# Patient Record
Sex: Male | Born: 1995 | Race: Black or African American | Hispanic: No | Marital: Married | Smoking: Never smoker
Health system: Southern US, Community
[De-identification: ages and names within clinical notes are randomized; demographics above are authoritative.]

## PROBLEM LIST (undated history)

## (undated) DIAGNOSIS — S83209A Unspecified tear of unspecified meniscus, current injury, unspecified knee, initial encounter: Secondary | ICD-10-CM

## (undated) DIAGNOSIS — F419 Anxiety disorder, unspecified: Secondary | ICD-10-CM

## (undated) DIAGNOSIS — G43909 Migraine, unspecified, not intractable, without status migrainosus: Secondary | ICD-10-CM

## (undated) HISTORY — PX: FRACTURE SURGERY: SHX138

---
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
## 2014-01-18 ENCOUNTER — Encounter (HOSPITAL_COMMUNITY): Payer: Self-pay | Admitting: Emergency Medicine

## 2014-01-18 ENCOUNTER — Emergency Department (HOSPITAL_COMMUNITY)
Admission: EM | Admit: 2014-01-18 | Discharge: 2014-01-18 | Disposition: A | Discharge status: Home / Self Care | Payer: Medicaid Other | Attending: Emergency Medicine | Admitting: Emergency Medicine

## 2014-01-18 DIAGNOSIS — Z87828 Personal history of other (healed) physical injury and trauma: Secondary | ICD-10-CM | POA: Diagnosis not present

## 2014-01-18 DIAGNOSIS — M25562 Pain in left knee: Secondary | ICD-10-CM

## 2014-01-18 DIAGNOSIS — M25569 Pain in unspecified knee: Secondary | ICD-10-CM | POA: Insufficient documentation

## 2014-01-18 MED ORDER — NAPROXEN 500 MG PO TABS: 500.0000 mg | ORAL_TABLET | Freq: Two times a day (BID) | ORAL | Status: DC

## 2014-01-18 NOTE — ED Notes (Signed)
Pt reports left knee pain that started this am. Denies fall or injury. 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 Pain 7/10. Pt ambulates without a limp.

## 2014-01-18 NOTE — Discharge Instructions (Signed)
Take the prescribed medication as directed.  May wish to ice and elevate your knee to help with pain. Wrap knees or wear brace while working for extra support. Follow-up with orthopedics. Return to the ED for new or worsening symptoms.

## 2014-01-18 NOTE — ED Provider Notes (Signed)
CSN: [id number]     Arrival date & time 01/18/14  1010 History   First MD Initiated Contact with Patient 01/18/14 1028     Chief Complaint  Patient presents with  . Knee Pain     (Consider location/radiation/quality/duration/timing/severity/associated sxs/prior Treatment) Patient is a 18 y.o. male presenting with knee pain. The history is provided by the patient and medical records.  Knee Pain  18 y.o. M with left knee pain, onset this morning.  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  He is often bending and squatting to lift heavy boxes.  Pain described as a generalized ache, worse on lateral aspect of his knee.  Denies recent injuries, trauma, or falls.  Pt has prior right knee injuries and usually bears most of his weight on his left knee.  Denies numbness, weakness, paresthesias.  No intervention tried PTA.  Past Medical History  Diagnosis Date  . Meniscus tear    History reviewed. No pertinent past surgical history. Family History  Problem Relation Age of Onset  . Diabetes Other   . Hypertension Other    History  Substance Use Topics  . Smoking status: Never Smoker   . Smokeless tobacco: Not on file  . Alcohol Use: No    Review of Systems  Musculoskeletal: Positive for arthralgias.  All other systems reviewed and are negative.     Allergies  Review of patient's allergies indicates no known allergies.  Home Medications   Prior to Admission medications   Medication Sig Start Date End Date Taking? Authorizing Provider  ibuprofen (ADVIL,MOTRIN) 600 MG tablet Take 1 tablet (600 mg total) by mouth every 6 (six) hours as needed. 08/28/13   Noland Fordyce, PA-C  ibuprofen (ADVIL,MOTRIN) 600 MG tablet Take 1 tablet (600 mg total) by mouth every 6 (six) hours as needed. 12/01/13   Garald Balding, NP  naproxen (NAPROSYN) 500 MG tablet Take 1 tablet (500 mg total) by mouth 2 (two) times daily with a meal. 01/18/14   Larene Pickett, PA-C   BP 117/89   Pulse 78  Temp(Src) 98.6 F (37 C) (Oral)  Resp 16  SpO2 99% Physical Exam  Nursing note and vitals reviewed. Constitutional: He is oriented to person, place, and time. He appears well-developed and well-nourished. No distress.  HENT:  Head: Normocephalic and atraumatic.  Mouth/Throat: Oropharynx is clear and moist.  Eyes: Conjunctivae and EOM are normal. Pupils are equal, round, and reactive to light.  Neck: Normal range of motion. Neck supple.  Cardiovascular: Normal rate, regular rhythm and normal heart sounds.   Pulmonary/Chest: Effort normal and breath sounds normal. No respiratory distress. He has no wheezes.  Musculoskeletal: Normal range of motion.       Left knee: He exhibits normal range of motion, no swelling, no effusion, no ecchymosis, no deformity and no erythema. No tenderness found.  Left knee without focal tenderness, full ROM maintained, mild crepitus noted with full flexion; no gross bony deformities or swelling; DP pulse and sensation intact throughout leg; normal strength; ambulating unassisted without difficulty  Neurological: He is alert and oriented to person, place, and time.  Skin: Skin is warm and dry. He is not diaphoretic.  Psychiatric: He has a normal mood and affect.    ED Course  Procedures (including critical care time) Labs Review Labs Reviewed - No data to display  Imaging Review No results found.   EKG Interpretation None      MDM  Final diagnoses:  Left knee pain   Atraumatic left knee pain.  No bony deformities, leg NVI.  Low suspicion for fx, imaging deferred.  Start on naprosyn, recommended ace wrap/knee sleeve for support.  FU with orthopedics.  Discussed plan with patient, he/she acknowledged understanding and agreed with plan of care.  Return precautions given for new or worsening symptoms.  Larene Pickett, PA-C 01/18/14 1038

## 2014-01-20 NOTE — ED Provider Notes (Signed)
Medical screening examination/treatment/procedure(s) were performed by non-physician practitioner and as supervising physician I was immediately available for consultation/collaboration.  Leota Jacobsen, MD 01/20/14 [phone]

## 2014-01-27 ENCOUNTER — Emergency Department (HOSPITAL_COMMUNITY)
Admission: EM | Admit: 2014-01-27 | Discharge: 2014-01-28 | Disposition: A | Discharge status: Home / Self Care | Payer: Medicaid Other | Attending: Emergency Medicine | Admitting: Emergency Medicine

## 2014-01-27 ENCOUNTER — Encounter (HOSPITAL_COMMUNITY): Payer: Self-pay | Admitting: Emergency Medicine

## 2014-01-27 DIAGNOSIS — Z87828 Personal history of other (healed) physical injury and trauma: Secondary | ICD-10-CM | POA: Diagnosis not present

## 2014-01-27 DIAGNOSIS — R1110 Vomiting, unspecified: Secondary | ICD-10-CM | POA: Insufficient documentation

## 2014-01-27 DIAGNOSIS — Z8679 Personal history of other diseases of the circulatory system: Secondary | ICD-10-CM | POA: Diagnosis not present

## 2014-01-27 DIAGNOSIS — G4489 Other headache syndrome: Secondary | ICD-10-CM | POA: Insufficient documentation

## 2014-01-27 HISTORY — DX: Migraine, unspecified, not intractable, without status migrainosus: G43.909

## 2014-01-27 NOTE — ED Notes (Signed)
Pt states he has a hx of migraines and woke up this morning feeling nauseous and with a bad HA. Pt states he has vomited several times today.

## 2014-01-28 MED ORDER — KETOROLAC TROMETHAMINE 60 MG/2ML IM SOLN
60.0000 mg | Freq: Once | INTRAMUSCULAR | Status: AC
  Administered 2014-01-28: 60 mg via INTRAMUSCULAR
  Filled 2014-01-28: qty 2

## 2014-01-28 MED ORDER — DIPHENHYDRAMINE HCL 25 MG PO CAPS
50.0000 mg | ORAL_CAPSULE | Freq: Once | ORAL | Status: AC
  Administered 2014-01-28: 50 mg via ORAL
  Filled 2014-01-28: qty 2

## 2014-01-28 MED ORDER — METOCLOPRAMIDE HCL 5 MG/ML IJ SOLN
10.0000 mg | Freq: Once | INTRAMUSCULAR | Status: AC
  Administered 2014-01-28: 10 mg via INTRAMUSCULAR
  Filled 2014-01-28: qty 2

## 2014-01-28 NOTE — ED Provider Notes (Signed)
CSN: [id number]     Arrival date & time 01/27/14  2246 History   First MD Initiated Contact with Patient 01/28/14 0005     Chief Complaint  Patient presents with  . Emesis  . Headache     (Consider location/radiation/quality/duration/timing/severity/associated sxs/prior Treatment) Patient is a 18 y.o. male presenting with headaches. The history is provided by the patient. No language interpreter was used.  Headache Associated symptoms: nausea and vomiting   Associated symptoms: no abdominal pain and no fever   Associated symptoms comment:  He complains of headache for 2 days that is similar to his migraine headache pattern. No fever. He has nausea with vomiting, denies photophobia. It is described as a throbbing pain he states is behind both eyes although he has difficulty with determining the location.    Past Medical History  Diagnosis Date  . Meniscus tear   . Migraines    History reviewed. No pertinent past surgical history. Family History  Problem Relation Age of Onset  . Diabetes Other   . Hypertension Other    History  Substance Use Topics  . Smoking status: Never Smoker   . Smokeless tobacco: Never Used  . Alcohol Use: No    Review of Systems  Constitutional: Negative for fever and chills.  Respiratory: Negative.   Cardiovascular: Negative.   Gastrointestinal: Positive for nausea and vomiting. Negative for abdominal pain.  Musculoskeletal: Negative.   Skin: Negative.   Neurological: Positive for headaches.      Allergies  Review of patient's allergies indicates no known allergies.  Home Medications   Prior to Admission medications   Medication Sig Start Date End Date Taking? Authorizing Provider  naproxen (NAPROSYN) 500 MG tablet Take 1 tablet (500 mg total) by mouth 2 (two) times daily with a meal. 01/18/14  Yes Larene Pickett, PA-C   BP 115/70  Pulse 61  Temp(Src) 98.4 F (36.9 C) (Oral)  Resp 16  SpO2 97% Physical Exam  Constitutional: He is  oriented to person, place, and time. He appears well-developed and well-nourished.  HENT:  Head: Normocephalic and atraumatic.  Eyes: EOM are normal. Pupils are equal, round, and reactive to light.  Neck: Normal range of motion.  Cardiovascular: Normal rate and regular rhythm.   No murmur heard. Pulmonary/Chest: Effort normal and breath sounds normal. He has no wheezes. He has no rales.  Abdominal: Soft. There is no tenderness.  Neurological: He is alert and oriented to person, place, and time. He has normal strength and normal reflexes. No sensory deficit. He displays a negative Romberg sign.  He is alert, communicative, NAD, smiling. No deficits of coordination. CN's 3-12 grossly intact.   Skin: Skin is warm and dry.  Psychiatric: He has a normal mood and affect.    ED Course  Procedures (including critical care time) Labs Review Labs Reviewed - No data to display  Imaging Review No results found.   EKG Interpretation None      MDM   Final diagnoses:  None    1. Headache  No neurologic deficits on exam in a very well appearing patient who looks quite comfortable and in NAD. Headache cocktail given. He can be discharged home to follow up with his regular physician.    Dewaine Oats, PA-C 01/28/14 0041

## 2014-01-28 NOTE — ED Provider Notes (Signed)
Medical screening examination/treatment/procedure(s) were conducted as a shared visit with non-physician practitioner(s) or resident  and myself.  I personally evaluated the patient during the encounter and agree with the findings.   I have personally reviewed any xrays and/ or EKG's with the provider and I agree with interpretation.   Headaches previous, gradual onset, multiple previous migraine-like headaches in the past. Patient has not seen neurology. I discussed close followup with primary Dr. and referral for neurology and headaches continued to worsen or become more frequent. Normal neuro exam in ER.5+ strength in UE and LE with f/e at major joints. Neck supple no meningismus Sensation to palpation intact in UE and LE. CNs 2-12 grossly intact.  EOMFI.  PERRL.   Finger nose and coordination intact bilateral.   Visual fields intact to finger testing.  Headache  Mariea Clonts, MD 01/28/14 [phone]

## 2014-01-28 NOTE — Discharge Instructions (Signed)

## 2014-01-29 ENCOUNTER — Emergency Department (HOSPITAL_COMMUNITY)
Admission: EM | Admit: 2014-01-29 | Discharge: 2014-01-29 | Discharge status: Against Medical Advice | Payer: Medicaid Other | Source: Home / Self Care

## 2014-01-29 ENCOUNTER — Encounter (HOSPITAL_COMMUNITY): Payer: Self-pay | Admitting: Emergency Medicine

## 2014-01-29 ENCOUNTER — Emergency Department (HOSPITAL_COMMUNITY)
Admission: EM | Admit: 2014-01-29 | Discharge: 2014-01-30 | Disposition: A | Discharge status: Home / Self Care | Payer: Medicaid Other | Attending: Emergency Medicine | Admitting: Emergency Medicine

## 2014-01-29 DIAGNOSIS — Z87828 Personal history of other (healed) physical injury and trauma: Secondary | ICD-10-CM | POA: Insufficient documentation

## 2014-01-29 DIAGNOSIS — G43909 Migraine, unspecified, not intractable, without status migrainosus: Secondary | ICD-10-CM | POA: Diagnosis not present

## 2014-01-29 DIAGNOSIS — Z791 Long term (current) use of non-steroidal anti-inflammatories (NSAID): Secondary | ICD-10-CM | POA: Diagnosis not present

## 2014-01-29 DIAGNOSIS — R519 Headache, unspecified: Secondary | ICD-10-CM

## 2014-01-29 DIAGNOSIS — R51 Headache: Secondary | ICD-10-CM | POA: Insufficient documentation

## 2014-01-29 DIAGNOSIS — J3489 Other specified disorders of nose and nasal sinuses: Secondary | ICD-10-CM | POA: Diagnosis not present

## 2014-01-29 DIAGNOSIS — Z79899 Other long term (current) drug therapy: Secondary | ICD-10-CM | POA: Diagnosis not present

## 2014-01-29 DIAGNOSIS — R110 Nausea: Secondary | ICD-10-CM | POA: Insufficient documentation

## 2014-01-29 NOTE — ED Notes (Signed)
Called for patient in lobby.  No answer.   Staff at window noted pt left building.  Will try again.

## 2014-01-29 NOTE — ED Notes (Signed)
Called again.  Pt not found in dept.

## 2014-01-29 NOTE — ED Notes (Signed)
Per pt, has hx of headache. Was seen two days ago for same.  Has had headache x 2 weeks.  States MD gave him shots 2 days ago but relief only lasted one day 1.

## 2014-01-30 ENCOUNTER — Encounter (HOSPITAL_COMMUNITY): Payer: Self-pay | Admitting: Emergency Medicine

## 2014-01-30 MED ORDER — OXYMETAZOLINE HCL 0.05 % NA SOLN
2.0000 | Freq: Once | NASAL | Status: AC
  Administered 2014-01-30: 2 via NASAL
  Filled 2014-01-30: qty 15

## 2014-01-30 MED ORDER — IBUPROFEN 200 MG PO TABS
600.0000 mg | ORAL_TABLET | Freq: Once | ORAL | Status: AC
  Administered 2014-01-30: 600 mg via ORAL
  Filled 2014-01-30: qty 3

## 2014-01-30 MED ORDER — IBUPROFEN 600 MG PO TABS: 600.0000 mg | ORAL_TABLET | Freq: Four times a day (QID) | ORAL | Status: DC | PRN

## 2014-01-30 MED ORDER — METOCLOPRAMIDE HCL 10 MG PO TABS: 10.0000 mg | ORAL_TABLET | Freq: Four times a day (QID) | ORAL | Status: DC | PRN

## 2014-01-30 MED ORDER — METOCLOPRAMIDE HCL 10 MG PO TABS
10.0000 mg | ORAL_TABLET | Freq: Once | ORAL | Status: AC
  Administered 2014-01-30: 10 mg via ORAL
  Filled 2014-01-30: qty 1

## 2014-01-30 MED ORDER — TRAMADOL HCL 50 MG PO TABS
50.0000 mg | ORAL_TABLET | Freq: Once | ORAL | Status: AC
  Administered 2014-01-30: 50 mg via ORAL
  Filled 2014-01-30: qty 1

## 2014-01-30 MED ORDER — LORATADINE 10 MG PO TABS: 10.0000 mg | ORAL_TABLET | Freq: Every day | ORAL | Status: DC

## 2014-01-30 MED ORDER — MOMETASONE FUROATE 50 MCG/ACT NA SUSP: 2.0000 | Freq: Every day | NASAL | Status: DC

## 2014-01-30 MED ORDER — LORATADINE 10 MG PO TABS
10.0000 mg | ORAL_TABLET | Freq: Once | ORAL | Status: AC
  Administered 2014-01-30: 10 mg via ORAL
  Filled 2014-01-30: qty 1

## 2014-01-30 MED ORDER — OXYMETAZOLINE HCL 0.05 % NA SOLN: 1.0000 | Freq: Two times a day (BID) | NASAL | Status: DC

## 2014-01-30 MED ORDER — TRAMADOL HCL 50 MG PO TABS: 50.0000 mg | ORAL_TABLET | Freq: Four times a day (QID) | ORAL | Status: DC | PRN

## 2014-01-30 NOTE — ED Provider Notes (Signed)
CSN: [id number]     Arrival date & time 01/29/14  2315 History   First MD Initiated Contact with Patient 01/30/14 0132     Chief Complaint  Patient presents with  . Headache  . Nausea  . Fatigue     (Consider location/radiation/quality/duration/timing/severity/associated sxs/prior Treatment) HPI Patient presents with one week of frontal headache. He has mild nausea but denies any photophobia. Admits to fatigue and subjective fevers. Patient has had nasal congestion with a postnasal drip. Denies cough. Denies neck pain or stiffness. Has no focal weakness or numbness. Has no vision changes. Past Medical History  Diagnosis Date  . Meniscus tear   . Migraines    History reviewed. No pertinent past surgical history. Family History  Problem Relation Age of Onset  . Diabetes Other   . Hypertension Other    History  Substance Use Topics  . Smoking status: Never Smoker   . Smokeless tobacco: Never Used  . Alcohol Use: No    Review of Systems  Constitutional: Positive for fever. Negative for chills.  HENT: Positive for congestion, rhinorrhea and sinus pressure. Negative for sore throat.   Eyes: Negative for photophobia and visual disturbance.  Cardiovascular: Negative for chest pain, palpitations and leg swelling.  Gastrointestinal: Positive for nausea. Negative for vomiting, abdominal pain and diarrhea.  Musculoskeletal: Negative for back pain, neck pain and neck stiffness.  Skin: Negative for rash and wound.  Neurological: Positive for headaches. Negative for dizziness, weakness, light-headedness and numbness.  All other systems reviewed and are negative.     Allergies  Review of patient's allergies indicates no known allergies.  Home Medications   Prior to Admission medications   Medication Sig Start Date End Date Taking? Authorizing Provider  naproxen (NAPROSYN) 500 MG tablet Take 1 tablet (500 mg total) by mouth 2 (two) times daily with a meal. 01/18/14  Yes Larene Pickett, PA-C  ibuprofen (ADVIL,MOTRIN) 600 MG tablet Take 1 tablet (600 mg total) by mouth every 6 (six) hours as needed. 01/30/14   Julianne Rice, MD  loratadine (CLARITIN) 10 MG tablet Take 1 tablet (10 mg total) by mouth daily. One po daily x 5 days 01/30/14   Julianne Rice, MD  metoCLOPramide (REGLAN) 10 MG tablet Take 1 tablet (10 mg total) by mouth every 6 (six) hours as needed for nausea (nausea/headache). 01/30/14   Julianne Rice, MD  mometasone (NASONEX) 50 MCG/ACT nasal spray Place 2 sprays into the nose daily. 01/30/14   Julianne Rice, MD  oxymetazoline (AFRIN NASAL SPRAY) 0.05 % nasal spray Place 1 spray into both nostrils 2 (two) times daily. 01/30/14   Julianne Rice, MD  traMADol (ULTRAM) 50 MG tablet Take 1 tablet (50 mg total) by mouth every 6 (six) hours as needed. 01/30/14   Julianne Rice, MD   BP 135/74  Pulse 72  Temp(Src) 99.4 F (37.4 C) (Oral)  Resp 14  SpO2 97% Physical Exam  Nursing note and vitals reviewed. Constitutional: He is oriented to person, place, and time. He appears well-developed and well-nourished. No distress.  HENT:  Head: Normocephalic and atraumatic.  Mouth/Throat: Oropharynx is clear and moist.  Nasal mucosal edema. Right frontal sinus tenderness to percussion. Bilateral TMs normal.  Eyes: EOM are normal. Pupils are equal, round, and reactive to light.  Neck: Normal range of motion. Neck supple.  No meningismus  Cardiovascular: Normal rate and regular rhythm.   Pulmonary/Chest: Effort normal and breath sounds normal. No respiratory distress. He has no wheezes. He has no  rales. He exhibits no tenderness.  Abdominal: Soft. Bowel sounds are normal. He exhibits no distension and no mass. There is no tenderness. There is no rebound and no guarding.  Musculoskeletal: Normal range of motion. He exhibits no edema and no tenderness.  Neurological: He is alert and oriented to person, place, and time.  5/5 motor in all extremities. Sensation is  intact. Patient is ambulatory without assistance.  Skin: Skin is warm and dry. No rash noted. No erythema.  Psychiatric: He has a normal mood and affect. His behavior is normal.    ED Course  Procedures (including critical care time) Labs Review Labs Reviewed - No data to display  Imaging Review No results found.   EKG Interpretation None      MDM   Final diagnoses:  Sinus headache    Exam consistent with frontal sinusitis. No red flag signs or symptoms necessitating further workup. Advise follow up with his primary doctor. Return precautions given.    Julianne Rice, MD 01/30/14 [phone]

## 2014-01-30 NOTE — ED Notes (Signed)
Pt c/o HA, nausea, fatigue x1 week. Seen here 2 days ago and treated for same. Reports relief but symptoms returned the next day. Denies vomiting, light or sound sensitivity.

## 2014-01-30 NOTE — Discharge Instructions (Signed)
Sinus Headache °A sinus headache is when your sinuses become clogged or swollen. Sinus headaches can range from mild to severe.  °CAUSES °A sinus headache can have different causes, such as: °· Colds. °· Sinus infections. °· Allergies. °SYMPTOMS  °Symptoms of a sinus headache may vary and can include: °· Headache. °· Pain or pressure in the face. °· Congested or runny nose. °· Fever. °· Inability to smell. °· Pain in upper teeth. °Weather changes can make symptoms worse. °TREATMENT  °The treatment of a sinus headache depends on the cause. °· Sinus pain caused by a sinus infection may be treated with antibiotic medicine. °· Sinus pain caused by allergies may be helped by allergy medicines (antihistamines) and medicated nasal sprays. °· Sinus pain caused by congestion may be helped by flushing the nose and sinuses with saline solution. °HOME CARE INSTRUCTIONS  °· If antibiotics are prescribed, take them as directed. Finish them even if you start to feel better. °· Only take over-the-counter or prescription medicines for pain, discomfort, or fever as directed by your caregiver. °· If you have congestion, use a nasal spray to help reduce pressure. °SEEK IMMEDIATE MEDICAL CARE IF: °· You have a fever. °· You have headaches more than once a week. °· You have sensitivity to light or sound. °· You have repeated nausea and vomiting. °· You have vision problems. °· You have sudden, severe pain in your face or head. °· You have a seizure. °· You are confused. °· Your sinus headaches do not get better after treatment. Many people think they have a sinus headache when they actually have migraines or tension headaches. °MAKE SURE YOU:  °· Understand these instructions. °· Will watch your condition. °· Will get help right away if you are not doing well or get worse. °Document Released: 07/05/2004 Document Revised: 08/20/2011 Document Reviewed: 08/26/2010 °[employer]® Patient Information ©2015 [employer]. This information is not  intended to replace advice given to you by your health care provider. Make sure you discuss any questions you have with your health care provider. ° °

## 2014-02-10 ENCOUNTER — Emergency Department (HOSPITAL_COMMUNITY)
Admission: EM | Admit: 2014-02-10 | Discharge: 2014-02-10 | Disposition: A | Discharge status: Home / Self Care | Payer: Medicaid Other | Attending: Emergency Medicine | Admitting: Emergency Medicine

## 2014-02-10 ENCOUNTER — Encounter (HOSPITAL_COMMUNITY): Payer: Self-pay | Admitting: Emergency Medicine

## 2014-02-10 ENCOUNTER — Emergency Department (HOSPITAL_COMMUNITY): Payer: Medicaid Other

## 2014-02-10 DIAGNOSIS — M25569 Pain in unspecified knee: Secondary | ICD-10-CM | POA: Diagnosis present

## 2014-02-10 DIAGNOSIS — G43909 Migraine, unspecified, not intractable, without status migrainosus: Secondary | ICD-10-CM | POA: Diagnosis not present

## 2014-02-10 DIAGNOSIS — Z79899 Other long term (current) drug therapy: Secondary | ICD-10-CM | POA: Insufficient documentation

## 2014-02-10 DIAGNOSIS — Z87828 Personal history of other (healed) physical injury and trauma: Secondary | ICD-10-CM | POA: Diagnosis not present

## 2014-02-10 DIAGNOSIS — M25561 Pain in right knee: Secondary | ICD-10-CM

## 2014-02-10 MED ORDER — MELOXICAM 15 MG PO TABS: 15.0000 mg | ORAL_TABLET | Freq: Every day | ORAL | Status: DC

## 2014-02-10 MED ORDER — HYDROCODONE-ACETAMINOPHEN 5-325 MG PO TABS
2.0000 | ORAL_TABLET | Freq: Once | ORAL | Status: AC
  Administered 2014-02-10: 2 via ORAL
  Filled 2014-02-10: qty 2

## 2014-02-10 MED ORDER — HYDROCODONE-ACETAMINOPHEN 5-325 MG PO TABS: 1.0000 | ORAL_TABLET | ORAL | Status: DC | PRN

## 2014-02-10 NOTE — ED Provider Notes (Signed)
CSN: [id number]     Arrival date & time 02/10/14  1918 History  This chart was scribed for non-physician practitioner, Alvina Chou, PA-C working with Malvin Johns, MD by Einar Pheasant, ED scribe. This patient was seen in room WTR5/WTR5 and the patient's care was started at 9:09 PM.    Chief Complaint  Patient presents with  . Knee Pain   The history is provided by the patient. No language interpreter was used.   HPI Comments: Bradley Larson is a 18 y.o. male who presents to the Emergency Department complaining of worsening right knee pain that has been intermittent for 2 months but worsened 3 days ago. Pt states that the pain is worsened by walking and bearing weight. [name] does report the one episode of his knee "locking up" on him and had to "pop it back in place". Pt reports taking Ibuprofen with minimal relief until he starts work.  He denies any recent falls, abdominal pain, nausea, weakness, SOB, numbness, myalgias, or leg swelling.  Past Medical History  Diagnosis Date  . Meniscus tear   . Migraines    History reviewed. No pertinent past surgical history. Family History  Problem Relation Age of Onset  . Diabetes Other   . Hypertension Other    History  Substance Use Topics  . Smoking status: Never Smoker   . Smokeless tobacco: Never Used  . Alcohol Use: No    Review of Systems  Constitutional: Negative for fever.  Musculoskeletal: Positive for arthralgias. Negative for joint swelling and myalgias.  Neurological: Negative for weakness and numbness.   Allergies  Review of patient's allergies indicates no known allergies.  Home Medications   Prior to Admission medications   Medication Sig Start Date End Date Taking? Authorizing Provider  ibuprofen (ADVIL,MOTRIN) 600 MG tablet Take 1 tablet (600 mg total) by mouth every 6 (six) hours as needed. 01/30/14   Julianne Rice, MD  loratadine (CLARITIN) 10 MG tablet Take 1 tablet (10 mg total) by mouth daily. One  po daily x 5 days 01/30/14   Julianne Rice, MD  metoCLOPramide (REGLAN) 10 MG tablet Take 1 tablet (10 mg total) by mouth every 6 (six) hours as needed for nausea (nausea/headache). 01/30/14   Julianne Rice, MD  mometasone (NASONEX) 50 MCG/ACT nasal spray Place 2 sprays into the nose daily. 01/30/14   Julianne Rice, MD  naproxen (NAPROSYN) 500 MG tablet Take 1 tablet (500 mg total) by mouth 2 (two) times daily with a meal. 01/18/14   Larene Pickett, PA-C  oxymetazoline (AFRIN NASAL SPRAY) 0.05 % nasal spray Place 1 spray into both nostrils 2 (two) times daily. 01/30/14   Julianne Rice, MD  traMADol (ULTRAM) 50 MG tablet Take 1 tablet (50 mg total) by mouth every 6 (six) hours as needed. 01/30/14   Julianne Rice, MD   BP 130/79  Pulse 91  Temp(Src) 98.2 F (36.8 C) (Oral)  Resp 18  SpO2 98%  Physical Exam  Nursing note and vitals reviewed. Constitutional: He is oriented to person, place, and time. He appears well-developed and well-nourished. No distress.  HENT:  Head: Normocephalic and atraumatic.  Eyes: Conjunctivae and EOM are normal.  Neck: Neck supple.  Cardiovascular: Normal rate, regular rhythm, normal heart sounds and intact distal pulses.  Exam reveals no gallop and no friction rub.   No murmur heard. Pulmonary/Chest: Effort normal. No respiratory distress.  Musculoskeletal: Normal range of motion.  FROM of right knee no tenderness to palpation. Mild edema noted  to generalized knee. No palpiteal tenderness. No obvious deformities. Pt reports pain when standing.   Negative Lachman and anterior drawer test of the right knee.   Neurological: He is alert and oriented to person, place, and time.  Skin: Skin is warm and dry.  Psychiatric: He has a normal mood and affect. His behavior is normal.    ED Course  Procedures (including critical care time)  DIAGNOSTIC STUDIES: Oxygen Saturation is 98% on RA, normal by my interpretation.    COORDINATION OF CARE: 9:13 PM-Advised  pt to keep the right knee elevated and apply cold compresses. Pt states that he has crutches and a knee brace at home. Will give pt a referral to on call orthopaedist.  Will prescribe pain medication and give pt a note off or work. Pt advised of plan for treatment and pt agrees.  Imaging Review Dg Knee 2 Views Right  02/10/2014   CLINICAL DATA:  KNEE PAIN  EXAM: RIGHT KNEE - 1-2 VIEW  COMPARISON:  05/21/2012  FINDINGS: There is no evidence of fracture, dislocation, or joint effusion. Progressive sclerotic changes in the lateral tibial plateau and marginal spurring from the lateral femoral condyle. Soft tissues are unremarkable.  IMPRESSION: 1. Negative for fracture or other acute bone abnormality. 2. Progressive lateral compartment degenerative change.   Electronically Signed   By: Arne Cleveland M.D.   On: 02/10/2014 20:30     MDM   Final diagnoses:  Right knee pain    9:20 PM Patient's xray unremarkable for acute fracture. Patient has a knee sleeve that he is currently wearing. I have advised him to continue wearing the knee sleeve. Patient will have recommended follow up with Orthopedics for further evaluation. Patient will have mobic and Vicodin for pain. No neurovascular compromise. No other injuries. Vitals stable and patient afebrile.   I personally performed the services described in this documentation, which was scribed in my presence. The recorded information has been reviewed and is accurate.    Alvina Chou, PA-C 02/10/14 2122

## 2014-02-10 NOTE — ED Notes (Signed)
Pt states that he had right knee pain off and on for several months; pt states that he landed on it "funny" on Monday; pt c/o rt knee pain; pt states pain is worse when weight is applied; pt able to bend and move without difficulty; pt states "It hurts to that but not as bad as walking"

## 2014-02-10 NOTE — Discharge Instructions (Signed)
Rest, ice and elevate your knee for pain relief. Follow up with Dr. Marlou Sa for further evaluation of your knee pain. Take Mobic for pain as needed. Take Vicodin as needed for extra pain relief when you are home and not driving or working. Refer to attached documents for more information.

## 2014-02-11 NOTE — ED Provider Notes (Signed)
Medical screening examination/treatment/procedure(s) were performed by non-physician practitioner and as supervising physician I was immediately available for consultation/collaboration.   EKG Interpretation None        Malvin Johns, MD 02/11/14 [phone]

## 2014-02-18 ENCOUNTER — Encounter (HOSPITAL_COMMUNITY): Payer: Self-pay | Admitting: Emergency Medicine

## 2014-02-18 ENCOUNTER — Emergency Department (HOSPITAL_COMMUNITY)
Admission: EM | Admit: 2014-02-18 | Discharge: 2014-02-18 | Disposition: A | Discharge status: Home / Self Care | Payer: Medicaid Other | Attending: Emergency Medicine | Admitting: Emergency Medicine

## 2014-02-18 DIAGNOSIS — S8990XA Unspecified injury of unspecified lower leg, initial encounter: Secondary | ICD-10-CM | POA: Diagnosis not present

## 2014-02-18 DIAGNOSIS — Y9289 Other specified places as the place of occurrence of the external cause: Secondary | ICD-10-CM | POA: Diagnosis not present

## 2014-02-18 DIAGNOSIS — Y9389 Activity, other specified: Secondary | ICD-10-CM | POA: Insufficient documentation

## 2014-02-18 DIAGNOSIS — Z79899 Other long term (current) drug therapy: Secondary | ICD-10-CM | POA: Diagnosis not present

## 2014-02-18 DIAGNOSIS — G43909 Migraine, unspecified, not intractable, without status migrainosus: Secondary | ICD-10-CM | POA: Insufficient documentation

## 2014-02-18 DIAGNOSIS — X500XXA Overexertion from strenuous movement or load, initial encounter: Secondary | ICD-10-CM | POA: Insufficient documentation

## 2014-02-18 DIAGNOSIS — IMO0002 Reserved for concepts with insufficient information to code with codable children: Secondary | ICD-10-CM | POA: Diagnosis not present

## 2014-02-18 DIAGNOSIS — S99919A Unspecified injury of unspecified ankle, initial encounter: Principal | ICD-10-CM

## 2014-02-18 DIAGNOSIS — M25571 Pain in right ankle and joints of right foot: Secondary | ICD-10-CM

## 2014-02-18 DIAGNOSIS — S99929A Unspecified injury of unspecified foot, initial encounter: Principal | ICD-10-CM

## 2014-02-18 DIAGNOSIS — M25561 Pain in right knee: Secondary | ICD-10-CM

## 2014-02-18 MED ORDER — NAPROXEN 500 MG PO TABS: 500.0000 mg | ORAL_TABLET | Freq: Two times a day (BID) | ORAL | Status: DC

## 2014-02-18 NOTE — ED Notes (Signed)
Pt called a cab for transport

## 2014-02-18 NOTE — ED Notes (Signed)
Pt reports R knee pain, has been seen here for same last week.  No getting any better.

## 2014-02-18 NOTE — ED Provider Notes (Signed)
CSN: [id number]     Arrival date & time 02/18/14  2109 History  This chart was scribed for Antonietta Breach, PA-C working with Kalman Drape, MD by Randa Evens, ED Scribe. This patient was seen in room WTR8/WTR8 and the patient's care was started at 11:09 PM.      Chief Complaint  Patient presents with  . Knee Pain  . Ankle Pain   Patient is a 18 y.o. male presenting with knee pain and ankle pain. The history is provided by the patient. No language interpreter was used.  Knee Pain Ankle Pain  HPI Comments: Bradley Larson is a 18 y.o. male who presents to the Emergency Departmentcomplaining of right knee onset May 2015. He states he has associated right ankle pain. He states that he may have tore a ligament in his knee. He states that he was walking up the steps and felt a pop in his knee and the knee locked up on him. He states that he has been having problem with the knee since being hit with a forklift pallet 4 months ago at work. He states that his knee pain worsens when at work due to bending, lifting and being on his feet for extended periods of time. He states he has been taking ibuprofen with some relief. He states he has an follow up appointment with his PCP in October.   Past Medical History  Diagnosis Date  . Meniscus tear   . Migraines    History reviewed. No pertinent past surgical history. Family History  Problem Relation Age of Onset  . Diabetes Other   . Hypertension Other    History  Substance Use Topics  . Smoking status: Never Smoker   . Smokeless tobacco: Never Used  . Alcohol Use: No    Review of Systems  Musculoskeletal: Positive for arthralgias. Negative for gait problem.    Allergies  Review of patient's allergies indicates no known allergies.  Home Medications   Prior to Admission medications   Medication Sig Start Date End Date Taking? Authorizing Provider  HYDROcodone-acetaminophen (NORCO/VICODIN) 5-325 MG per tablet Take 1-2 tablets by mouth  every 4 (four) hours as needed for moderate pain or severe pain. 02/10/14  Yes Kaitlyn Szekalski, PA-C  ibuprofen (ADVIL,MOTRIN) 600 MG tablet Take 1 tablet (600 mg total) by mouth every 6 (six) hours as needed. 01/30/14  Yes Julianne Rice, MD  loratadine (CLARITIN) 10 MG tablet Take 1 tablet (10 mg total) by mouth daily. One po daily x 5 days 01/30/14  Yes Julianne Rice, MD  metoCLOPramide (REGLAN) 10 MG tablet Take 1 tablet (10 mg total) by mouth every 6 (six) hours as needed for nausea (nausea/headache). 01/30/14  Yes Julianne Rice, MD  mometasone (NASONEX) 50 MCG/ACT nasal spray Place 2 sprays into the nose daily. 01/30/14  Yes Julianne Rice, MD  oxymetazoline (AFRIN NASAL SPRAY) 0.05 % nasal spray Place 1 spray into both nostrils 2 (two) times daily. 01/30/14  Yes Julianne Rice, MD  traMADol (ULTRAM) 50 MG tablet Take 1 tablet (50 mg total) by mouth every 6 (six) hours as needed. 01/30/14  Yes Julianne Rice, MD  naproxen (NAPROSYN) 500 MG tablet Take 1 tablet (500 mg total) by mouth 2 (two) times daily. 02/18/14   Antonietta Breach, PA-C   Triage Vitals: BP 124/80  Pulse 92  Resp 20  SpO2 100%  Physical Exam  Nursing note and vitals reviewed. Constitutional: He is oriented to person, place, and time. He appears well-developed and well-nourished.  No distress.  Nontoxic/nonseptic appearing  HENT:  Head: Normocephalic and atraumatic.  Eyes: Conjunctivae and EOM are normal. No scleral icterus.  Neck: Normal range of motion.  Cardiovascular: Normal rate, regular rhythm and intact distal pulses.   DP and PT pulses 2+ b/l  Pulmonary/Chest: Effort normal. No respiratory distress.  Musculoskeletal: Normal range of motion. He exhibits tenderness.       Right knee: He exhibits normal range of motion, no effusion, no deformity, no erythema, normal alignment, no LCL laxity, no bony tenderness and no MCL laxity. Tenderness found. Medial joint line tenderness noted.       Right ankle: Normal.        Right lower leg: Normal.  No bony tenderness, crepitus, or deformity of R knee. 5/5 strength against resistance with knee flexion and extension.  Neurological: He is alert and oriented to person, place, and time. He exhibits normal muscle tone. Coordination normal.  No gross sensory deficits appreciated. Patellar and Achilles reflexes 2+ bilaterally. Patient ambulatory with normal gait.  Skin: Skin is warm and dry. No rash noted. He is not diaphoretic. No erythema. No pallor.  Psychiatric: He has a normal mood and affect. His behavior is normal.    ED Course  Procedures (including critical care time) DIAGNOSTIC STUDIES: Oxygen Saturation is 100% on RA, normal by my interpretation.    COORDINATION OF CARE: 11:19 PM-Discussed treatment plan which includes orthopedic referral with pt at bedside and pt agreed to plan.   Labs Review Labs Reviewed - No data to display  Imaging Review No results found.   EKG Interpretation None      MDM   Final diagnoses:  Right knee pain  Ankle pain, right    Patient presents for persistent right knee pain. He states that his knee "locked up" on him today. He states he has had similar pain for the past 5+ months. His occupation requires frequent bending and lifting. Patient today is neurovascularly intact. No gross sensory deficits appreciated. He is able to ambulate with normal gait. No bony deformity or laxity to suggest the need for emergent imaging. Patient in knee sleeve which he wore to the emergency department. Have advised RICE and Naproxen and will refer to orthopedics given chronicity of symptoms. Return precautions discussed and provided. Patient agreeable to plan with no unaddressed concerns.  I personally performed the services described in this documentation, which was scribed in my presence. The recorded information has been reviewed and is accurate.   Filed Vitals:   02/18/14 2137  BP: 124/80  Pulse: 92  Resp: 20  SpO2: 100%        Antonietta Breach, PA-C 02/19/14 0017

## 2014-02-18 NOTE — Discharge Instructions (Signed)
Knee Pain °The knee is the complex joint between your thigh and your lower leg. It is made up of bones, tendons, ligaments, and cartilage. The bones that make up the knee are: °· The femur in the thigh. °· The tibia and fibula in the lower leg. °· The patella or kneecap riding in the groove on the lower femur. °CAUSES  °Knee pain is a common complaint with many causes. A few of these causes are: °· Injury, such as: °¨ A ruptured ligament or tendon injury. °¨ Torn cartilage. °· Medical conditions, such as: °¨ Gout °¨ Arthritis °¨ Infections °· Overuse, over training, or overdoing a physical activity. °Knee pain can be minor or severe. Knee pain can accompany debilitating injury. Minor knee problems often respond well to self-care measures or get well on their own. More serious injuries may need medical intervention or even surgery. °SYMPTOMS °The knee is complex. Symptoms of knee problems can vary widely. Some of the problems are: °· Pain with movement and weight bearing. °· Swelling and tenderness. °· Buckling of the knee. °· Inability to straighten or extend your knee. °· Your knee locks and you cannot straighten it. °· Warmth and redness with pain and fever. °· Deformity or dislocation of the kneecap. °DIAGNOSIS  °Determining what is wrong may be very straight forward such as when there is an injury. It can also be challenging because of the complexity of the knee. Tests to make a diagnosis may include: °· Your caregiver taking a history and doing a physical exam. °· Routine X-rays can be used to rule out other problems. X-rays will not reveal a cartilage tear. Some injuries of the knee can be diagnosed by: °¨ Arthroscopy a surgical technique by which a small video camera is inserted through tiny incisions on the sides of the knee. This procedure is used to examine and repair internal knee joint problems. Tiny instruments can be used during arthroscopy to repair the torn knee cartilage (meniscus). °¨ Arthrography  is a radiology technique. A contrast liquid is directly injected into the knee joint. Internal structures of the knee joint then become visible on X-ray film. °¨ An MRI scan is a non X-ray radiology procedure in which magnetic fields and a computer produce two- or three-dimensional images of the inside of the knee. Cartilage tears are often visible using an MRI scanner. MRI scans have largely replaced arthrography in diagnosing cartilage tears of the knee. °· Blood work. °· Examination of the fluid that helps to lubricate the knee joint (synovial fluid). This is done by taking a sample out using a needle and a syringe. °TREATMENT °The treatment of knee problems depends on the cause. Some of these treatments are: °· Depending on the injury, proper casting, splinting, surgery, or physical therapy care will be needed. °· Give yourself adequate recovery time. Do not overuse your joints. If you begin to get sore during workout routines, back off. Slow down or do fewer repetitions. °· For repetitive activities such as cycling or running, maintain your strength and nutrition. °· Alternate muscle groups. For example, if you are a weight lifter, work the upper body on one day and the lower body the next. °· Either tight or weak muscles do not give the proper support for your knee. Tight or weak muscles do not absorb the stress placed on the knee joint. Keep the muscles surrounding the knee strong. °· Take care of mechanical problems. °¨ If you have flat feet, orthotics or special shoes may help.   See your caregiver if you need help. °¨ Arch supports, sometimes with wedges on the inner or outer aspect of the heel, can help. These can shift pressure away from the side of the knee most bothered by osteoarthritis. °¨ A brace called an "unloader" brace also may be used to help ease the pressure on the most arthritic side of the knee. °· If your caregiver has prescribed crutches, braces, wraps or ice, use as directed. The acronym  for this is PRICE. This means protection, rest, ice, compression, and elevation. °· Nonsteroidal anti-inflammatory drugs (NSAIDs), can help relieve pain. But if taken immediately after an injury, they may actually increase swelling. Take NSAIDs with food in your stomach. Stop them if you develop stomach problems. Do not take these if you have a history of ulcers, stomach pain, or bleeding from the bowel. Do not take without your caregiver's approval if you have problems with fluid retention, heart failure, or kidney problems. °· For ongoing knee problems, physical therapy may be helpful. °· Glucosamine and chondroitin are over-the-counter dietary supplements. Both may help relieve the pain of osteoarthritis in the knee. These medicines are different from the usual anti-inflammatory drugs. Glucosamine may decrease the rate of cartilage destruction. °· Injections of a corticosteroid drug into your knee joint may help reduce the symptoms of an arthritis flare-up. They may provide pain relief that lasts a few months. You may have to wait a few months between injections. The injections do have a small increased risk of infection, water retention, and elevated blood sugar levels. °· Hyaluronic acid injected into damaged joints may ease pain and provide lubrication. These injections may work by reducing inflammation. A series of shots may give relief for as long as 6 months. °· Topical painkillers. Applying certain ointments to your skin may help relieve the pain and stiffness of osteoarthritis. Ask your pharmacist for suggestions. Many over the-counter products are approved for temporary relief of arthritis pain. °· In some countries, doctors often prescribe topical NSAIDs for relief of chronic conditions such as arthritis and tendinitis. A review of treatment with NSAID creams found that they worked as well as oral medications but without the serious side effects. °PREVENTION °· Maintain a healthy weight. Extra pounds  put more strain on your joints. °· Get strong, stay limber. Weak muscles are a common cause of knee injuries. Stretching is important. Include flexibility exercises in your workouts. °· Be smart about exercise. If you have osteoarthritis, chronic knee pain or recurring injuries, you may need to change the way you exercise. This does not mean you have to stop being active. If your knees ache after jogging or playing basketball, consider switching to swimming, water aerobics, or other low-impact activities, at least for a few days a week. Sometimes limiting high-impact activities will provide relief. °· Make sure your shoes fit well. Choose footwear that is right for your sport. °· Protect your knees. Use the proper gear for knee-sensitive activities. Use kneepads when playing volleyball or laying carpet. Buckle your seat belt every time you drive. Most shattered kneecaps occur in car accidents. °· Rest when you are tired. °SEEK MEDICAL CARE IF:  °You have knee pain that is continual and does not seem to be getting better.  °SEEK IMMEDIATE MEDICAL CARE IF:  °Your knee joint feels hot to the touch and you have a high fever. °MAKE SURE YOU:  °· Understand these instructions. °· Will watch your condition. °· Will get help right away if you are not   doing well or get worse. °Document Released: 03/25/2007 Document Revised: 08/20/2011 Document Reviewed: 03/25/2007 °[employer]® Patient Information ©2015 [employer]. This information is not intended to replace advice given to you by your health care provider. Make sure you discuss any questions you have with your health care provider. ° ° °RICE: Routine Care for Injuries °The routine care of many injuries includes Rest, Ice, Compression, and Elevation (RICE). °HOME CARE INSTRUCTIONS °· Rest is needed to allow your body to heal. Routine activities can usually be resumed when comfortable. Injured tendons and bones can take up to 6 weeks to heal. Tendons are the cord-like  structures that attach muscle to bone. °· Ice following an injury helps keep the swelling down and reduces pain. °¨ Put ice in a plastic bag. °¨ Place a towel between your skin and the bag. °¨ Leave the ice on for 15-20 minutes, 3-4 times a day, or as directed by your health care provider. Do this while awake, for the first 24 to 48 hours. After that, continue as directed by your caregiver. °· Compression helps keep swelling down. It also gives support and helps with discomfort. If an elastic bandage has been applied, it should be removed and reapplied every 3 to 4 hours. It should not be applied tightly, but firmly enough to keep swelling down. Watch fingers or toes for swelling, bluish discoloration, coldness, numbness, or excessive pain. If any of these problems occur, remove the bandage and reapply loosely. Contact your caregiver if these problems continue. °· Elevation helps reduce swelling and decreases pain. With extremities, such as the arms, hands, legs, and feet, the injured area should be placed near or above the level of the heart, if possible. °SEEK IMMEDIATE MEDICAL CARE IF: °· You have persistent pain and swelling. °· You develop redness, numbness, or unexpected weakness. °· Your symptoms are getting worse rather than improving after several days. °These symptoms may indicate that further evaluation or further X-rays are needed. Sometimes, X-rays may not show a small broken bone (fracture) until 1 week or 10 days later. Make a follow-up appointment with your caregiver. Ask when your X-ray results will be ready. Make sure you get your X-ray results. °Document Released: 09/09/2000 Document Revised: 06/02/2013 Document Reviewed: 10/27/2010 °Exit[employer]® Patient Information ©2015 [employer]. This information is not intended to replace advice given to you by your health care provider. Make sure you discuss any questions you have with your health care provider. ° °

## 2014-02-18 NOTE — ED Notes (Signed)
Pt was seen and treated last week for this knee pain, tonight he thinks he reinjured the same knee

## 2014-02-19 NOTE — ED Provider Notes (Signed)
Medical screening examination/treatment/procedure(s) were performed by non-physician practitioner and as supervising physician I was immediately available for consultation/collaboration.   EKG Interpretation None       Kalman Drape, MD 02/19/14 [phone]

## 2014-02-28 ENCOUNTER — Emergency Department (HOSPITAL_COMMUNITY)
Admission: EM | Admit: 2014-02-28 | Discharge: 2014-02-28 | Disposition: A | Discharge status: Home / Self Care | Payer: Medicaid Other | Attending: Emergency Medicine | Admitting: Emergency Medicine

## 2014-02-28 ENCOUNTER — Encounter (HOSPITAL_COMMUNITY): Payer: Self-pay | Admitting: Emergency Medicine

## 2014-02-28 DIAGNOSIS — Z791 Long term (current) use of non-steroidal anti-inflammatories (NSAID): Secondary | ICD-10-CM | POA: Diagnosis not present

## 2014-02-28 DIAGNOSIS — J069 Acute upper respiratory infection, unspecified: Secondary | ICD-10-CM

## 2014-02-28 DIAGNOSIS — R059 Cough, unspecified: Secondary | ICD-10-CM | POA: Diagnosis present

## 2014-02-28 DIAGNOSIS — R05 Cough: Secondary | ICD-10-CM | POA: Insufficient documentation

## 2014-02-28 DIAGNOSIS — Z8679 Personal history of other diseases of the circulatory system: Secondary | ICD-10-CM | POA: Insufficient documentation

## 2014-02-28 DIAGNOSIS — Z87828 Personal history of other (healed) physical injury and trauma: Secondary | ICD-10-CM | POA: Diagnosis not present

## 2014-02-28 NOTE — ED Notes (Signed)
Pt c/o dry cough, nasal congestion and fatigue x 3 days.

## 2014-02-28 NOTE — Discharge Instructions (Signed)
Take acetaminophen (Tylenol) up to 975 mg (this is normally 3 over-the-counter pills) up to 3 times a day. Do not drink alcohol. Make sure your other medications do not contain acetaminophen (Read the labels!)  Push fluids: take small frequent sips of water or Gatorade, do not drink any soda, juice or caffeinated beverages.    Please follow with your primary care doctor in the next 2 days for a check-up. They must obtain records for further management.   Do not hesitate to return to the Emergency Department for any new, worsening or concerning symptoms.    Upper Respiratory Infection, Adult An upper respiratory infection (URI) is also sometimes known as the common cold. The upper respiratory tract includes the nose, sinuses, throat, trachea, and bronchi. Bronchi are the airways leading to the lungs. Most people improve within 1 week, but symptoms can last up to 2 weeks. A residual cough may last even longer.  CAUSES Many different viruses can infect the tissues lining the upper respiratory tract. The tissues become irritated and inflamed and often become very moist. Mucus production is also common. A cold is contagious. You can easily spread the virus to others by oral contact. This includes kissing, sharing a glass, coughing, or sneezing. Touching your mouth or nose and then touching a surface, which is then touched by another person, can also spread the virus. SYMPTOMS  Symptoms typically develop 1 to 3 days after you come in contact with a cold virus. Symptoms vary from person to person. They may include:  Runny nose.  Sneezing.  Nasal congestion.  Sinus irritation.  Sore throat.  Loss of voice (laryngitis).  Cough.  Fatigue.  Muscle aches.  Loss of appetite.  Headache.  Low-grade fever. DIAGNOSIS  You might diagnose your own cold based on familiar symptoms, since most people get a cold 2 to 3 times a year. Your caregiver can confirm this based on your exam. Most  importantly, your caregiver can check that your symptoms are not due to another disease such as strep throat, sinusitis, pneumonia, asthma, or epiglottitis. Blood tests, throat tests, and X-rays are not necessary to diagnose a common cold, but they may sometimes be helpful in excluding other more serious diseases. Your caregiver will decide if any further tests are required. RISKS AND COMPLICATIONS  You may be at risk for a more severe case of the common cold if you smoke cigarettes, have chronic heart disease (such as heart failure) or lung disease (such as asthma), or if you have a weakened immune system. The very young and very old are also at risk for more serious infections. Bacterial sinusitis, middle ear infections, and bacterial pneumonia can complicate the common cold. The common cold can worsen asthma and chronic obstructive pulmonary disease (COPD). Sometimes, these complications can require emergency medical care and may be life-threatening. PREVENTION  The best way to protect against getting a cold is to practice good hygiene. Avoid oral or hand contact with people with cold symptoms. Wash your hands often if contact occurs. There is no clear evidence that vitamin C, vitamin E, echinacea, or exercise reduces the chance of developing a cold. However, it is always recommended to get plenty of rest and practice good nutrition. TREATMENT  Treatment is directed at relieving symptoms. There is no cure. Antibiotics are not effective, because the infection is caused by a virus, not by bacteria. Treatment may include:  Increased fluid intake. Sports drinks offer valuable electrolytes, sugars, and fluids.  Breathing heated mist or  steam (vaporizer or shower).  Eating chicken soup or other clear broths, and maintaining good nutrition.  Getting plenty of rest.  Using gargles or lozenges for comfort.  Controlling fevers with ibuprofen or acetaminophen as directed by your caregiver.  Increasing  usage of your inhaler if you have asthma. Zinc gel and zinc lozenges, taken in the first 24 hours of the common cold, can shorten the duration and lessen the severity of symptoms. Pain medicines may help with fever, muscle aches, and throat pain. A variety of non-prescription medicines are available to treat congestion and runny nose. Your caregiver can make recommendations and may suggest nasal or lung inhalers for other symptoms.  HOME CARE INSTRUCTIONS   Only take over-the-counter or prescription medicines for pain, discomfort, or fever as directed by your caregiver.  Use a warm mist humidifier or inhale steam from a shower to increase air moisture. This may keep secretions moist and make it easier to breathe.  Drink enough water and fluids to keep your urine clear or pale yellow.  Rest as needed.  Return to work when your temperature has returned to normal or as your caregiver advises. You may need to stay home longer to avoid infecting others. You can also use a face mask and careful hand washing to prevent spread of the virus. SEEK MEDICAL CARE IF:   After the first few days, you feel you are getting worse rather than better.  You need your caregiver's advice about medicines to control symptoms.  You develop chills, worsening shortness of breath, or brown or red sputum. These may be signs of pneumonia.  You develop yellow or brown nasal discharge or pain in the face, especially when you bend forward. These may be signs of sinusitis.  You develop a fever, swollen neck glands, pain with swallowing, or white areas in the back of your throat. These may be signs of strep throat. SEEK IMMEDIATE MEDICAL CARE IF:   You have a fever.  You develop severe or persistent headache, ear pain, sinus pain, or chest pain.  You develop wheezing, a prolonged cough, cough up blood, or have a change in your usual mucus (if you have chronic lung disease).  You develop sore muscles or a stiff  neck. Document Released: 11/21/2000 Document Revised: 08/20/2011 Document Reviewed: 09/02/2013 [facility] Patient Information 2015 [place], [place]. This information is not intended to replace advice given to you by your health care provider. Make sure you discuss any questions you have with your health care provider.

## 2014-02-28 NOTE — ED Provider Notes (Signed)
CSN: [id number]     Arrival date & time 02/28/14  1854 History  This chart was scribed for a non-physician practitioner, Monico Blitz, PA-C working with Arbie Cookey, MD by [place]ique Peace, ED Scribe. The patient was seen in [facility]. The patient's care was started at 7:30 PM.    Chief Complaint  Patient presents with  . Cough  . Nasal Congestion  . Fatigue      The history is provided by the patient. No language interpreter was used.   HPI Comments: Bradley Larson is a 18 y.o. male who presents to the Emergency Department complaining of nasal congestion onset 3 days with associated dry cough, fatigue, SOB, and headache. Pt reports experiencing rhinorrhea and one episode of vomiting yesterday. Pt states that he did not formally take his temperature but has been feeling very hot. He denies any chills, rash, cervicalgia, recent travel, tick bites or camping.   Past Medical History  Diagnosis Date  . Meniscus tear   . Migraines    No past surgical history on file. Family History  Problem Relation Age of Onset  . Diabetes Other   . Hypertension Other    History  Substance Use Topics  . Smoking status: Never Smoker   . Smokeless tobacco: Never Used  . Alcohol Use: No    Review of Systems  Constitutional: Positive for fever and fatigue. Negative for chills.  HENT: Positive for congestion and rhinorrhea.   Respiratory: Positive for cough and shortness of breath.   Gastrointestinal: Positive for vomiting.  Neurological: Positive for headaches.   A complete 10 system review of systems was obtained and all systems are negative except as noted in the HPI and PMH.     Allergies  Review of patient's allergies indicates no known allergies.  Home Medications   Prior to Admission medications   Medication Sig Start Date End Date Taking? Authorizing Provider  HYDROcodone-acetaminophen (NORCO/VICODIN) 5-325 MG per tablet Take 1-2 tablets by mouth every 4 (four) hours  as needed for moderate pain or severe pain. 02/10/14   Kaitlyn Szekalski, PA-C  ibuprofen (ADVIL,MOTRIN) 600 MG tablet Take 1 tablet (600 mg total) by mouth every 6 (six) hours as needed. 01/30/14   Julianne Rice, MD  loratadine (CLARITIN) 10 MG tablet Take 1 tablet (10 mg total) by mouth daily. One po daily x 5 days 01/30/14   Julianne Rice, MD  metoCLOPramide (REGLAN) 10 MG tablet Take 1 tablet (10 mg total) by mouth every 6 (six) hours as needed for nausea (nausea/headache). 01/30/14   Julianne Rice, MD  mometasone (NASONEX) 50 MCG/ACT nasal spray Place 2 sprays into the nose daily. 01/30/14   Julianne Rice, MD  naproxen (NAPROSYN) 500 MG tablet Take 1 tablet (500 mg total) by mouth 2 (two) times daily. 02/18/14   Antonietta Breach, PA-C  oxymetazoline (AFRIN NASAL SPRAY) 0.05 % nasal spray Place 1 spray into both nostrils 2 (two) times daily. 01/30/14   Julianne Rice, MD  traMADol (ULTRAM) 50 MG tablet Take 1 tablet (50 mg total) by mouth every 6 (six) hours as needed. 01/30/14   Julianne Rice, MD   BP 132/71  Pulse 88  Temp(Src) 98.1 F (36.7 C) (Oral)  Resp 18  SpO2 100% Physical Exam  Nursing note and vitals reviewed. Constitutional: He is oriented to person, place, and time. He appears well-developed and well-nourished. No distress.  HENT:  Head: Normocephalic.  Mouth/Throat: Oropharynx is clear and moist.  + Rhinorrhea, no tenderness palpation over  maxillary or frontal sinuses. Tympanic membranes with normal architecture and good light reflex, posterior pharynx is mildly injected, no tonsillar hypertrophy or exudate.  Eyes: Conjunctivae and EOM are normal. Pupils are equal, round, and reactive to light.  Neck: Normal range of motion. Neck supple.  FROM to C-spine. Pt can touch chin to chest without discomfort. No TTP of midline cervical spine.   Cardiovascular: Normal rate, regular rhythm and intact distal pulses.   Pulmonary/Chest: Effort normal and breath sounds normal. No stridor.  No respiratory distress. He has no wheezes. He has no rales. He exhibits no tenderness.  Abdominal: Soft. Bowel sounds are normal. He exhibits no distension and no mass. There is no tenderness. There is no rebound and no guarding.  Musculoskeletal: Normal range of motion.  Neurological: He is alert and oriented to person, place, and time.  Psychiatric: He has a normal mood and affect.    ED Course  Procedures (including critical care time) Labs Review Labs Reviewed - No data to display  Results for orders placed during the hospital encounter of 08/28/13  RAPID STREP SCREEN      Result Value Ref Range   Streptococcus, Group A Screen (Direct) NEGATIVE  NEGATIVE  CULTURE, GROUP A STREP      Result Value Ref Range   Specimen Description THROAT     Special Requests NONE     Culture       Value: No Beta Hemolytic Streptococci Isolated     Performed at [facility]   Report Status 08/30/2013 FINAL     Dg Knee 2 Views Right  02/10/2014   CLINICAL DATA:  KNEE PAIN  EXAM: RIGHT KNEE - 1-2 VIEW  COMPARISON:  05/21/2012  FINDINGS: There is no evidence of fracture, dislocation, or joint effusion. Progressive sclerotic changes in the lateral tibial plateau and marginal spurring from the lateral femoral condyle. Soft tissues are unremarkable.  IMPRESSION: 1. Negative for fracture or other acute bone abnormality. 2. Progressive lateral compartment degenerative change.   Electronically Signed   By: Arne Cleveland M.D.   On: 02/10/2014 20:30      Imaging Review No results found.   EKG Interpretation None     Medications - No data to display  7:33 PM- Treatment plan was discussed with patient who verbalizes understanding and agrees.   MDM   Final diagnoses:  URI, acute   Filed Vitals:   02/28/14 1904  BP: 132/71  Pulse: 88  Temp: 98.1 F (36.7 C)  TempSrc: Oral  Resp: 18  SpO2: 100%   Bradley Larson is a 18 y.o. male presenting with headache, dark cough, nasal  congestion and fatigue worsening over the case of 4 days. Patient is saturating 100% non-REM air, afebrile in the ED. He is not tachycardic or tachypneic. No indication for chest x-ray I doubt pneumonia. Likely viral URI. I advised him to rest and stay well hydrated. Encouraged acetaminophen for pain control and close followup with primary care.   Evaluation does not show pathology that would require ongoing emergent intervention or inpatient treatment. Pt is hemodynamically stable and mentating appropriately. Discussed findings and plan with patient/guardian, who agrees with care plan. All questions answered. Return precautions discussed and outpatient follow up given.   I personally performed the services described in this documentation, which was scribed in my presence. The recorded information has been reviewed and is accurate.   Monico Blitz, PA-C 02/28/14 1943

## 2014-03-01 NOTE — ED Provider Notes (Signed)
Medical screening examination/treatment/procedure(s) were performed by non-physician practitioner and as supervising physician I was immediately available for consultation/collaboration.   Houston Siren III, MD 03/01/14 [phone]

## 2014-03-04 ENCOUNTER — Encounter (HOSPITAL_COMMUNITY): Payer: Self-pay | Admitting: Emergency Medicine

## 2014-03-04 ENCOUNTER — Emergency Department (HOSPITAL_COMMUNITY): Payer: Medicaid Other

## 2014-03-04 ENCOUNTER — Emergency Department (HOSPITAL_COMMUNITY)
Admission: EM | Admit: 2014-03-04 | Discharge: 2014-03-04 | Disposition: A | Discharge status: Home / Self Care | Payer: Medicaid Other | Attending: Emergency Medicine | Admitting: Emergency Medicine

## 2014-03-04 DIAGNOSIS — S8990XA Unspecified injury of unspecified lower leg, initial encounter: Secondary | ICD-10-CM | POA: Diagnosis present

## 2014-03-04 DIAGNOSIS — Y92838 Other recreation area as the place of occurrence of the external cause: Secondary | ICD-10-CM

## 2014-03-04 DIAGNOSIS — M25561 Pain in right knee: Secondary | ICD-10-CM

## 2014-03-04 DIAGNOSIS — Z791 Long term (current) use of non-steroidal anti-inflammatories (NSAID): Secondary | ICD-10-CM | POA: Diagnosis not present

## 2014-03-04 DIAGNOSIS — S99919A Unspecified injury of unspecified ankle, initial encounter: Secondary | ICD-10-CM | POA: Diagnosis not present

## 2014-03-04 DIAGNOSIS — G43909 Migraine, unspecified, not intractable, without status migrainosus: Secondary | ICD-10-CM | POA: Insufficient documentation

## 2014-03-04 DIAGNOSIS — S99929A Unspecified injury of unspecified foot, initial encounter: Principal | ICD-10-CM

## 2014-03-04 DIAGNOSIS — Y9239 Other specified sports and athletic area as the place of occurrence of the external cause: Secondary | ICD-10-CM | POA: Insufficient documentation

## 2014-03-04 DIAGNOSIS — IMO0002 Reserved for concepts with insufficient information to code with codable children: Secondary | ICD-10-CM | POA: Diagnosis not present

## 2014-03-04 DIAGNOSIS — Y9367 Activity, basketball: Secondary | ICD-10-CM | POA: Insufficient documentation

## 2014-03-04 DIAGNOSIS — W219XXA Striking against or struck by unspecified sports equipment, initial encounter: Secondary | ICD-10-CM | POA: Insufficient documentation

## 2014-03-04 DIAGNOSIS — Z79899 Other long term (current) drug therapy: Secondary | ICD-10-CM | POA: Insufficient documentation

## 2014-03-04 MED ORDER — IBUPROFEN 800 MG PO TABS: 800.0000 mg | ORAL_TABLET | Freq: Three times a day (TID) | ORAL | Status: DC

## 2014-03-04 NOTE — Discharge Instructions (Signed)
No squatting or sports for one week. Keep knee elevated, ice. Ibuprofen for pain. Followup with orthopedics specialist.  Knee Pain The knee is the complex joint between your thigh and your lower leg. It is made up of bones, tendons, ligaments, and cartilage. The bones that make up the knee are:  The femur in the thigh.  The tibia and fibula in the lower leg.  The patella or kneecap riding in the groove on the lower femur. CAUSES  Knee pain is a common complaint with many causes. A few of these causes are:  Injury, such as:  A ruptured ligament or tendon injury.  Torn cartilage.  Medical conditions, such as:  Gout  Arthritis  Infections  Overuse, over training, or overdoing a physical activity. Knee pain can be minor or severe. Knee pain can accompany debilitating injury. Minor knee problems often respond well to self-care measures or get well on their own. More serious injuries may need medical intervention or even surgery. SYMPTOMS The knee is complex. Symptoms of knee problems can vary widely. Some of the problems are:  Pain with movement and weight bearing.  Swelling and tenderness.  Buckling of the knee.  Inability to straighten or extend your knee.  Your knee locks and you cannot straighten it.  Warmth and redness with pain and fever.  Deformity or dislocation of the kneecap. DIAGNOSIS  Determining what is wrong may be very straight forward such as when there is an injury. It can also be challenging because of the complexity of the knee. Tests to make a diagnosis may include:  Your caregiver taking a history and doing a physical exam.  Routine X-rays can be used to rule out other problems. X-rays will not reveal a cartilage tear. Some injuries of the knee can be diagnosed by:  Arthroscopy a surgical technique by which a small video camera is inserted through tiny incisions on the sides of the knee. This procedure is used to examine and repair internal knee  joint problems. Tiny instruments can be used during arthroscopy to repair the torn knee cartilage (meniscus).  Arthrography is a radiology technique. A contrast liquid is directly injected into the knee joint. Internal structures of the knee joint then become visible on X-ray film.  An MRI scan is a non X-ray radiology procedure in which magnetic fields and a computer produce two- or three-dimensional images of the inside of the knee. Cartilage tears are often visible using an MRI scanner. MRI scans have largely replaced arthrography in diagnosing cartilage tears of the knee.  Blood work.  Examination of the fluid that helps to lubricate the knee joint (synovial fluid). This is done by taking a sample out using a needle and a syringe. TREATMENT The treatment of knee problems depends on the cause. Some of these treatments are:  Depending on the injury, proper casting, splinting, surgery, or physical therapy care will be needed.  Give yourself adequate recovery time. Do not overuse your joints. If you begin to get sore during workout routines, back off. Slow down or do fewer repetitions.  For repetitive activities such as cycling or running, maintain your strength and nutrition.  Alternate muscle groups. For example, if you are a weight lifter, work the upper body on one day and the lower body the next.  Either tight or weak muscles do not give the proper support for your knee. Tight or weak muscles do not absorb the stress placed on the knee joint. Keep the muscles surrounding the knee  strong.  Take care of mechanical problems.  If you have flat feet, orthotics or special shoes may help. See your caregiver if you need help.  Arch supports, sometimes with wedges on the inner or outer aspect of the heel, can help. These can shift pressure away from the side of the knee most bothered by osteoarthritis.  A brace called an "unloader" brace also may be used to help ease the pressure on the most  arthritic side of the knee.  If your caregiver has prescribed crutches, braces, wraps or ice, use as directed. The acronym for this is PRICE. This means protection, rest, ice, compression, and elevation.  Nonsteroidal anti-inflammatory drugs (NSAIDs), can help relieve pain. But if taken immediately after an injury, they may actually increase swelling. Take NSAIDs with food in your stomach. Stop them if you develop stomach problems. Do not take these if you have a history of ulcers, stomach pain, or bleeding from the bowel. Do not take without your caregiver's approval if you have problems with fluid retention, heart failure, or kidney problems.  For ongoing knee problems, physical therapy may be helpful.  Glucosamine and chondroitin are over-the-counter dietary supplements. Both may help relieve the pain of osteoarthritis in the knee. These medicines are different from the usual anti-inflammatory drugs. Glucosamine may decrease the rate of cartilage destruction.  Injections of a corticosteroid drug into your knee joint may help reduce the symptoms of an arthritis flare-up. They may provide pain relief that lasts a few months. You may have to wait a few months between injections. The injections do have a small increased risk of infection, water retention, and elevated blood sugar levels.  Hyaluronic acid injected into damaged joints may ease pain and provide lubrication. These injections may work by reducing inflammation. A series of shots may give relief for as long as 6 months.  Topical painkillers. Applying certain ointments to your skin may help relieve the pain and stiffness of osteoarthritis. Ask your pharmacist for suggestions. Many over the-counter products are approved for temporary relief of arthritis pain.  In some countries, doctors often prescribe topical NSAIDs for relief of chronic conditions such as arthritis and tendinitis. A review of treatment with NSAID creams found that they  worked as well as oral medications but without the serious side effects. PREVENTION  Maintain a healthy weight. Extra pounds put more strain on your joints.  Get strong, stay limber. Weak muscles are a common cause of knee injuries. Stretching is important. Include flexibility exercises in your workouts.  Be smart about exercise. If you have osteoarthritis, chronic knee pain or recurring injuries, you may need to change the way you exercise. This does not mean you have to stop being active. If your knees ache after jogging or playing basketball, consider switching to swimming, water aerobics, or other low-impact activities, at least for a few days a week. Sometimes limiting high-impact activities will provide relief.  Make sure your shoes fit well. Choose footwear that is right for your sport.  Protect your knees. Use the proper gear for knee-sensitive activities. Use kneepads when playing volleyball or laying carpet. Buckle your seat belt every time you drive. Most shattered kneecaps occur in car accidents.  Rest when you are tired. SEEK MEDICAL CARE IF:  You have knee pain that is continual and does not seem to be getting better.  SEEK IMMEDIATE MEDICAL CARE IF:  Your knee joint feels hot to the touch and you have a high fever. MAKE SURE YOU:  Understand these instructions.  Will watch your condition.  Will get help right away if you are not doing well or get worse. Document Released: 03/25/2007 Document Revised: 08/20/2011 Document Reviewed: 03/25/2007 [facility] Patient Information 2015 [place], [place]. This information is not intended to replace advice given to you by your health care provider. Make sure you discuss any questions you have with your health care provider.

## 2014-03-04 NOTE — ED Provider Notes (Signed)
CSN: [id number]     Arrival date & time 03/04/14  2141 History  This chart was scribed for non-physician practitioner working with Ephraim Hamburger, MD, by Peyton Bottoms ED Scribe. This patient was seen in room WTR8/WTR8 and the patient's care was started at 9:58 PM   Chief Complaint  Patient presents with  . Knee Pain   Patient is a 18 y.o. male presenting with knee pain. The history is provided by the patient. No language interpreter was used.  Knee Pain Location:  Knee Injury: yes   Mechanism of injury: fall   Fall:    Fall occurred:  Recreating/playing   Impact surface:  Chief Technology Officer of impact:  Feet   Entrapped after fall: no   Knee location:  R knee Pain details:    Quality:  Aching   Radiates to:  Does not radiate   Severity:  Moderate   Onset quality:  Sudden   Timing:  Constant   Progression:  Unchanged Chronicity:  Recurrent Dislocation: no   Foreign body present:  No foreign bodies Prior injury to area:  Yes Relieved by:  Nothing Worsened by:  Nothing tried Ineffective treatments: ibuprofen.  HPI Comments: SOHAIB VEREEN is a 18 y.o. male with a prior history of a meniscus tear, who presents to the Emergency Department complaining of moderate right knee pain that began earlier today while patient was playing basketball. Patient states that he jumped into the air and landed on the side of his right knee when another player hit him midair. He states that he heard it pop and that it hurts to walk on it. He states that he has chronic knee pain due to previous injury to same area, and has been referred to see an orthopedist in October. Patient states he took ibuprofen today with minimal relief.  Past Medical History  Diagnosis Date  . Meniscus tear   . Migraines    History reviewed. No pertinent past surgical history. Family History  Problem Relation Age of Onset  . Diabetes Other   . Hypertension Other    History  Substance Use Topics  . Smoking  status: Never Smoker   . Smokeless tobacco: Never Used  . Alcohol Use: No    Review of Systems  Musculoskeletal:       Right knee pain.  All other systems reviewed and are negative.  Allergies  Review of patient's allergies indicates no known allergies.  Home Medications   Prior to Admission medications   Medication Sig Start Date End Date Taking? Authorizing Provider  HYDROcodone-acetaminophen (NORCO/VICODIN) 5-325 MG per tablet Take 1-2 tablets by mouth every 4 (four) hours as needed for moderate pain or severe pain. 02/10/14   Kaitlyn Szekalski, PA-C  ibuprofen (ADVIL,MOTRIN) 600 MG tablet Take 1 tablet (600 mg total) by mouth every 6 (six) hours as needed. 01/30/14   Julianne Rice, MD  loratadine (CLARITIN) 10 MG tablet Take 1 tablet (10 mg total) by mouth daily. One po daily x 5 days 01/30/14   Julianne Rice, MD  metoCLOPramide (REGLAN) 10 MG tablet Take 1 tablet (10 mg total) by mouth every 6 (six) hours as needed for nausea (nausea/headache). 01/30/14   Julianne Rice, MD  mometasone (NASONEX) 50 MCG/ACT nasal spray Place 2 sprays into the nose daily. 01/30/14   Julianne Rice, MD  naproxen (NAPROSYN) 500 MG tablet Take 1 tablet (500 mg total) by mouth 2 (two) times daily. 02/18/14   Antonietta Breach, PA-C  oxymetazoline (  AFRIN NASAL SPRAY) 0.05 % nasal spray Place 1 spray into both nostrils 2 (two) times daily. 01/30/14   Julianne Rice, MD  traMADol (ULTRAM) 50 MG tablet Take 1 tablet (50 mg total) by mouth every 6 (six) hours as needed. 01/30/14   Julianne Rice, MD   There were no vitals taken for this visit. Physical Exam  Nursing note and vitals reviewed. Constitutional: He is oriented to person, place, and time. He appears well-developed and well-nourished. No distress.  HENT:  Head: Normocephalic and atraumatic.  Eyes: Conjunctivae and EOM are normal.  Neck: Neck supple. No tracheal deviation present.  Cardiovascular: Normal rate.   Pulmonary/Chest: Effort normal. No  respiratory distress.  Musculoskeletal: Normal range of motion.  Normal appearing right knee. Tenderness over medial lateral knee joint. Full range of motion of the joint. Negative anterior posterior drawer signs. No laxity with medial or lateral stress. Patella tendon is intact.  Neurological: He is alert and oriented to person, place, and time.  Skin: Skin is warm and dry.  Psychiatric: He has a normal mood and affect. His behavior is normal.   ED Course  Procedures (including critical care time)   COORDINATION OF CARE: 10:06 PM- Discussed plans to order diagnostic imaging of right knee. Pt advised of plan for treatment and pt agrees.  Labs Review Labs Reviewed - No data to display  Imaging Review Dg Knee Complete 4 Views Right  03/04/2014   CLINICAL DATA:  Injury, pain  EXAM: RIGHT KNEE - COMPLETE 4+ VIEW  COMPARISON:  Prior study from 02/10/2014  FINDINGS: No acute fracture dislocation. No joint effusion. Mild degenerative osteoarthrosis seen involving the lateral femoral tibial joint space compartment. No soft tissue abnormality.  IMPRESSION: 1. No acute traumatic injury about the knee. 2. 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   Electronically Signed   By: Jeannine Boga M.D.   On: 03/04/2014 22:52     EKG Interpretation None     MDM   Final diagnoses:  Knee pain, acute, right   Patient with acute on chronic right knee pain. X-ray showing some osteoarthritis. Patient reports some clicking and popping sensation that knee. Based on today's exam, no obvious ligamentous injury. I do suspect however patient may have a meniscal tear. Will refer to orthopedics for further evaluation. Patient is stable for discharge home for ibuprofen for pain, ice, elevation, and followup.    I personally performed the services described in this documentation, which was scribed in my presence. The recorded information has been  reviewed and is accurate.  Renold Genta, PA-C 03/05/14 [phone]

## 2014-03-04 NOTE — ED Notes (Signed)
Pt reports he was playing basketball today-started to have R knee pain.  Pt has chronic knee pain, has been referred to an orthopedist, states case mgr made him an appt to see one in October.

## 2014-03-09 NOTE — ED Provider Notes (Signed)
Medical screening examination/treatment/procedure(s) were performed by non-physician practitioner and as supervising physician I was immediately available for consultation/collaboration.   EKG Interpretation None        Ephraim Hamburger, MD 03/09/14 2322

## 2014-03-18 ENCOUNTER — Encounter (HOSPITAL_COMMUNITY): Payer: Self-pay | Admitting: Emergency Medicine

## 2014-03-18 ENCOUNTER — Emergency Department (HOSPITAL_COMMUNITY)
Admission: EM | Admit: 2014-03-18 | Discharge: 2014-03-18 | Disposition: A | Discharge status: Home / Self Care | Attending: Emergency Medicine | Admitting: Emergency Medicine

## 2014-03-18 DIAGNOSIS — Z8679 Personal history of other diseases of the circulatory system: Secondary | ICD-10-CM | POA: Diagnosis not present

## 2014-03-18 DIAGNOSIS — Z87828 Personal history of other (healed) physical injury and trauma: Secondary | ICD-10-CM | POA: Diagnosis not present

## 2014-03-18 DIAGNOSIS — S00521A Blister (nonthermal) of lip, initial encounter: Secondary | ICD-10-CM | POA: Diagnosis present

## 2014-03-18 DIAGNOSIS — K130 Diseases of lips: Secondary | ICD-10-CM | POA: Diagnosis not present

## 2014-03-18 MED ORDER — SULFAMETHOXAZOLE-TRIMETHOPRIM 800-160 MG PO TABS: 1.0000 | ORAL_TABLET | Freq: Two times a day (BID) | ORAL | Status: DC

## 2014-03-18 NOTE — ED Provider Notes (Addendum)
CSN: [id number]     Arrival date & time 03/18/14  1207 History   First MD Initiated Contact with Patient 03/18/14 1233     Chief Complaint  Patient presents with  . blister on lip      (Consider location/radiation/quality/duration/timing/severity/associated sxs/prior Treatment) Patient is a 18 y.o. male presenting with rash. The history is provided by the patient.  Rash Location:  Mouth Mouth rash location:  Lower outer lip Quality: draining, painful, redness and swelling   Pain details:    Quality:  Sharp   Severity:  Moderate   Onset quality:  Gradual   Duration:  2 days   Timing:  Constant   Progression:  Worsening Severity:  Mild Onset quality:  Sudden Timing:  Constant Progression:  Worsening Chronicity:  New Relieved by: some improvement with warm compresses. Worsened by:  Nothing tried Associated symptoms comment:  Draining pus   Past Medical History  Diagnosis Date  . Meniscus tear   . Migraines    History reviewed. No pertinent past surgical history. Family History  Problem Relation Age of Onset  . Diabetes Other   . Hypertension Other    History  Substance Use Topics  . Smoking status: Never Smoker   . Smokeless tobacco: Never Used  . Alcohol Use: No    Review of Systems  Skin: Positive for rash.  All other systems reviewed and are negative.     Allergies  Review of patient's allergies indicates no known allergies.  Home Medications   Prior to Admission medications   Not on File   BP 137/81  Pulse 81  Temp(Src) 98.2 F (36.8 C) (Oral)  Resp 16  SpO2 100% Physical Exam  Nursing note and vitals reviewed. Constitutional: He is oriented to person, place, and time. He appears well-developed and well-nourished. No distress.  HENT:  Head: Normocephalic and atraumatic.  Mouth/Throat:    Cardiovascular: Normal rate.   Pulmonary/Chest: Effort normal.  Neurological: He is alert and oriented to person, place, and time.  Skin: Skin is  warm and dry. No rash noted. No erythema.  Psychiatric: He has a normal mood and affect. His behavior is normal.    ED Course  Procedures (including critical care time) Labs Review Labs Reviewed - No data to display  Imaging Review No results found.   EKG Interpretation None      MDM   Final diagnoses:  Lip abscess    Patient with small abscess to the lower lip with surrounding cellulitis. It is currently draining.  No other underlying complications. Will place patient on Bactrim and continue warm compresses    Blanchie Dessert, MD 03/18/14 1321  Blanchie Dessert, MD 03/18/14 1323

## 2014-03-18 NOTE — Discharge Instructions (Signed)
Apply neosporin to area 2 times a day Abscess An abscess (boil or furuncle) is an infected area on or under the skin. This area is filled with yellowish-white fluid (pus) and other material (debris). HOME CARE   Only take medicines as told by your doctor.  If you were given antibiotic medicine, take it as directed. Finish the medicine even if you start to feel better.  If gauze is used, follow your doctor's directions for changing the gauze.  To avoid spreading the infection:  Keep your abscess covered with a bandage.  Wash your hands well.  Do not share personal care items, towels, or whirlpools with others.  Avoid skin contact with others.  Keep your skin and clothes clean around the abscess.  Keep all doctor visits as told. GET HELP RIGHT AWAY IF:   You have more pain, puffiness (swelling), or redness in the wound site.  You have more fluid or blood coming from the wound site.  You have muscle aches, chills, or you feel sick.  You have a fever. MAKE SURE YOU:   Understand these instructions.  Will watch your condition.  Will get help right away if you are not doing well or get worse. Document Released: 11/14/2007 Document Revised: 11/27/2011 Document Reviewed: 08/10/2011 [facility] Patient Information 2015 [place], [place]. This information is not intended to replace advice given to you by your health care provider. Make sure you discuss any questions you have with your health care provider.

## 2014-03-18 NOTE — ED Notes (Signed)
Per pt, has blister on lip which appeared yesterday

## 2014-03-26 ENCOUNTER — Encounter (HOSPITAL_COMMUNITY): Payer: Self-pay | Admitting: Emergency Medicine

## 2014-03-26 ENCOUNTER — Emergency Department (HOSPITAL_COMMUNITY)
Admission: EM | Admit: 2014-03-26 | Discharge: 2014-03-26 | Disposition: A | Discharge status: Home / Self Care | Attending: Emergency Medicine | Admitting: Emergency Medicine

## 2014-03-26 DIAGNOSIS — M25562 Pain in left knee: Secondary | ICD-10-CM

## 2014-03-26 DIAGNOSIS — Z87828 Personal history of other (healed) physical injury and trauma: Secondary | ICD-10-CM | POA: Insufficient documentation

## 2014-03-26 DIAGNOSIS — Z8669 Personal history of other diseases of the nervous system and sense organs: Secondary | ICD-10-CM | POA: Diagnosis not present

## 2014-03-26 MED ORDER — IBUPROFEN 600 MG PO TABS: 600.0000 mg | ORAL_TABLET | Freq: Three times a day (TID) | ORAL | Status: DC | PRN

## 2014-03-26 MED ORDER — IBUPROFEN 800 MG PO TABS
800.0000 mg | ORAL_TABLET | Freq: Once | ORAL | Status: DC
Start: 2014-03-26 — End: 2014-03-27

## 2014-03-26 NOTE — Discharge Instructions (Signed)

## 2014-03-26 NOTE — ED Notes (Signed)
Pt was doing squats when he got a sharp pain in his left knee.

## 2014-03-26 NOTE — ED Notes (Signed)
Pt assessed and discharged by [facility].  Pt refused motrin ordered.

## 2014-03-27 NOTE — ED Provider Notes (Signed)
CSN: [id number]     Arrival date & time 03/26/14  2224 History   First MD Initiated Contact with Patient 03/26/14 2307     Chief Complaint  Patient presents with  . Knee Pain      HPI Patient states he was doing squats at home when he developed a sharp pain in his left knee.  He fell to both of the medial and lateral aspect.  He's been able to ambulate since then.  Denies weakness his left lower extremity.  No new swelling of his left knee.  Her recent injury or trauma besides the squatting injury today.   Past Medical History  Diagnosis Date  . Meniscus tear   . Migraines    History reviewed. No pertinent past surgical history. Family History  Problem Relation Age of Onset  . Diabetes Other   . Hypertension Other    History  Substance Use Topics  . Smoking status: Never Smoker   . Smokeless tobacco: Never Used  . Alcohol Use: No    Review of Systems  All other systems reviewed and are negative.     Allergies  Review of patient's allergies indicates no known allergies.  Home Medications   Prior to Admission medications   Medication Sig Start Date End Date Taking? Authorizing Provider  ibuprofen (ADVIL,MOTRIN) 600 MG tablet Take 1 tablet (600 mg total) by mouth every 8 (eight) hours as needed. 03/26/14   Hoy Morn, MD   BP 137/71  Pulse 77  Temp(Src) 98.7 F (37.1 C) (Oral)  Resp 18  SpO2 99% Physical Exam  Nursing note and vitals reviewed. Constitutional: He is oriented to person, place, and time. He appears well-developed and well-nourished.  HENT:  Head: Normocephalic.  Eyes: EOM are normal.  Neck: Normal range of motion.  Pulmonary/Chest: Effort normal.  Abdominal: He exhibits no distension.  Musculoskeletal: Normal range of motion.  Full range of motion of left ankle, left knee, left hip.  Normal pulses in the foot.  No obvious joint effusion of the left knee.  Mild tenderness of the medial and lateral joint lines.  Normal flexion at the knee.   No overlying skin changes.  Neurological: He is alert and oriented to person, place, and time.  Psychiatric: He has a normal mood and affect.    ED Course  Procedures (including critical care time) Labs Review Labs Reviewed - No data to display  Imaging Review No results found.   EKG Interpretation None      MDM   Final diagnoses:  Left knee pain    Knee pain.  May represent ligamentous injury. Doubt osseous injury.  No indication for x-ray    Hoy Morn, MD 03/27/14 [phone]

## 2014-04-02 DIAGNOSIS — Z8679 Personal history of other diseases of the circulatory system: Secondary | ICD-10-CM | POA: Diagnosis not present

## 2014-04-02 DIAGNOSIS — G8929 Other chronic pain: Secondary | ICD-10-CM | POA: Diagnosis not present

## 2014-04-02 DIAGNOSIS — Z87828 Personal history of other (healed) physical injury and trauma: Secondary | ICD-10-CM | POA: Insufficient documentation

## 2014-04-02 DIAGNOSIS — R51 Headache: Secondary | ICD-10-CM | POA: Diagnosis present

## 2014-04-03 ENCOUNTER — Emergency Department (HOSPITAL_COMMUNITY)
Admission: EM | Admit: 2014-04-03 | Discharge: 2014-04-03 | Disposition: A | Discharge status: Home / Self Care | Attending: Emergency Medicine | Admitting: Emergency Medicine

## 2014-04-03 ENCOUNTER — Encounter (HOSPITAL_COMMUNITY): Payer: Self-pay | Admitting: Emergency Medicine

## 2014-04-03 DIAGNOSIS — R51 Headache: Secondary | ICD-10-CM

## 2014-04-03 DIAGNOSIS — R519 Headache, unspecified: Secondary | ICD-10-CM

## 2014-04-03 MED ORDER — IBUPROFEN 800 MG PO TABS
800.0000 mg | ORAL_TABLET | Freq: Once | ORAL | Status: AC
  Administered 2014-04-03: 800 mg via ORAL
  Filled 2014-04-03: qty 1

## 2014-04-03 MED ORDER — METOCLOPRAMIDE HCL 10 MG PO TABS: 10.0000 mg | ORAL_TABLET | Freq: Three times a day (TID) | ORAL | Status: DC | PRN

## 2014-04-03 MED ORDER — METOCLOPRAMIDE HCL 10 MG PO TABS
10.0000 mg | ORAL_TABLET | Freq: Once | ORAL | Status: AC
  Administered 2014-04-03: 10 mg via ORAL
  Filled 2014-04-03: qty 1

## 2014-04-03 MED ORDER — DIPHENHYDRAMINE HCL 25 MG PO CAPS
50.0000 mg | ORAL_CAPSULE | Freq: Once | ORAL | Status: AC
  Administered 2014-04-03: 50 mg via ORAL
  Filled 2014-04-03: qty 2

## 2014-04-03 NOTE — ED Notes (Signed)
Pt arrived to the ED with a compliant of a migraine.  Pt states he has a hx of same but is not currently medicated for the condition.  Pt states migraine has been present for two days. Pt states the pain is located on the top anterior of his head.  Pt is photosensitve.

## 2014-04-03 NOTE — ED Provider Notes (Signed)
CSN: [id number]     Arrival date & time 04/02/14  2316 History   First MD Initiated Contact with Patient 04/03/14 0120     Chief Complaint  Patient presents with  . Migraine     (Consider location/radiation/quality/duration/timing/severity/associated sxs/prior Treatment) HPI Bradley Larson is a 18 y.o. male with past medical history of migraines coming in with headache. Patient states it's been going on for the past 2 weeks located on the top of his head. He states it is intermittent it is been on for couple days at a time and then off for a couple days. Patient states this is consistent with his normal history of headaches, he denies being any worse. Onset was gradual in gotten slowly worse over the course of 2 weeks. It is made worse by bright lights and movement. He denies any neurological symptoms such as blurry vision muscle weakness or numbness and tingling. He denies any fevers or sick contacts. Patient has no further complaints.  10 Systems reviewed and are negative for acute change except as noted in the HPI.     Past Medical History  Diagnosis Date  . Meniscus tear   . Migraines    History reviewed. No pertinent past surgical history. Family History  Problem Relation Age of Onset  . Diabetes Other   . Hypertension Other    History  Substance Use Topics  . Smoking status: Never Smoker   . Smokeless tobacco: Never Used  . Alcohol Use: No    Review of Systems    Allergies  Review of patient's allergies indicates no known allergies.  Home Medications   Prior to Admission medications   Medication Sig Start Date End Date Taking? Authorizing Provider  ibuprofen (ADVIL,MOTRIN) 600 MG tablet Take 600 mg by mouth every 6 (six) hours as needed for moderate pain.   Yes Historical Provider, MD   BP 144/78  Pulse 74  Temp(Src) 97.5 F (36.4 C) (Oral)  Resp 20  SpO2 98% Physical Exam  Nursing note and vitals reviewed. Constitutional: He is oriented to person,  place, and time. Vital signs are normal. He appears well-developed and well-nourished.  Non-toxic appearance. He does not appear ill. No distress.  HENT:  Head: Normocephalic and atraumatic.  Nose: Nose normal.  Mouth/Throat: Oropharynx is clear and moist. No oropharyngeal exudate.  Eyes: Conjunctivae and EOM are normal. Pupils are equal, round, and reactive to light. No scleral icterus.  Neck: Normal range of motion. Neck supple. No tracheal deviation, no edema, no erythema and normal range of motion present. No mass and no thyromegaly present.  Cardiovascular: Normal rate, regular rhythm, S1 normal, S2 normal, normal heart sounds, intact distal pulses and normal pulses.  Exam reveals no gallop and no friction rub.   No murmur heard. Pulses:      Radial pulses are 2+ on the right side, and 2+ on the left side.       Dorsalis pedis pulses are 2+ on the right side, and 2+ on the left side.  Pulmonary/Chest: Effort normal and breath sounds normal. No respiratory distress. He has no wheezes. He has no rhonchi. He has no rales.  Abdominal: Soft. Normal appearance and bowel sounds are normal. He exhibits no distension, no ascites and no mass. There is no hepatosplenomegaly. There is no tenderness. There is no rebound, no guarding and no CVA tenderness.  Musculoskeletal: Normal range of motion. He exhibits no edema and no tenderness.  Lymphadenopathy:    He has no cervical  adenopathy.  Neurological: He is alert and oriented to person, place, and time. He has normal strength. No cranial nerve deficit or sensory deficit. He exhibits normal muscle tone. GCS eye subscore is 4. GCS verbal subscore is 5. GCS motor subscore is 6.  Out of 5 strength x4 extremities. Normal sensation x4 extremities. Normal cerebellar testing.  Skin: Skin is warm, dry and intact. No petechiae and no rash noted. He is not diaphoretic. No erythema. No pallor.  Psychiatric: He has a normal mood and affect. His behavior is normal.  Judgment normal.    ED Course  Procedures (including critical care time) Labs Review Labs Reviewed - No data to display  Imaging Review No results found.   EKG Interpretation None      MDM   Final diagnoses:  None    Patient presents emergency department for acute on chronic headache. I have low concern for subarachnoid hemorrhage or meningitis as the patient states it was gradual in onset and consistent with his normal headaches. He has no fever his neck is supple. He was given Reglan, Motrin, Benadryl for treatment., Repeat assessment patient's pain went from a 9 to a 0. Will discharge with a few tablets of Reglan to take at home as needed. He does have a primary care physician and he can followup with in the next week. He saw signs remain within normal limits is safe for discharge.    Everlene Balls, MD 04/03/14 [phone]

## 2014-04-03 NOTE — Discharge Instructions (Signed)
Headaches, Frequently Asked Questions Bradley Larson, you were seen today for a headache which is consistent with your normal headache. Your symptoms resolved with medication, continue to take as prescribed. Follow up with your regular physician within 3 days for continued treatment. If your symptoms worsen or you develop blurry vision numbness or weakness come back to emergency department mainly for repeat evaluation. Thank you. MIGRAINE HEADACHES Q: What is migraine? What causes it? How can I treat it? A: Generally, migraine headaches begin as a dull ache. Then they develop into a constant, throbbing, and pulsating pain. You may experience pain at the temples. You may experience pain at the front or back of one or both sides of the head. The pain is usually accompanied by a combination of:  Nausea.  Vomiting.  Sensitivity to light and noise. Some people (about 15%) experience an aura (see below) before an attack. The cause of migraine is believed to be chemical reactions in the brain. Treatment for migraine may include over-the-counter or prescription medications. It may also include self-help techniques. These include relaxation training and biofeedback.  Q: What is an aura? A: About 15% of people with migraine get an "aura". This is a sign of neurological symptoms that occur before a migraine headache. You may see wavy or jagged lines, dots, or flashing lights. You might experience tunnel vision or blind spots in one or both eyes. The aura can include visual or auditory hallucinations (something imagined). It may include disruptions in smell (such as strange odors), taste or touch. Other symptoms include:  Numbness.  A "pins and needles" sensation.  Difficulty in recalling or speaking the correct word. These neurological events may last as long as 60 minutes. These symptoms will fade as the headache begins. Q: What is a trigger? A: Certain physical or environmental factors can lead to or  "trigger" a migraine. These include:  Foods.  Hormonal changes.  Weather.  Stress. It is important to remember that triggers are different for everyone. To help prevent migraine attacks, you need to figure out which triggers affect you. Keep a headache diary. This is a good way to track triggers. The diary will help you talk to your healthcare professional about your condition. Q: Does weather affect migraines? A: Bright sunshine, hot, humid conditions, and drastic changes in barometric pressure may lead to, or "trigger," a migraine attack in some people. But studies have shown that weather does not act as a trigger for everyone with migraines. Q: What is the link between migraine and hormones? A: Hormones start and regulate many of your body's functions. Hormones keep your body in balance within a constantly changing environment. The levels of hormones in your body are unbalanced at times. Examples are during menstruation, pregnancy, or menopause. That can lead to a migraine attack. In fact, about three quarters of all women with migraine report that their attacks are related to the menstrual cycle.  Q: Is there an increased risk of stroke for migraine sufferers? A: The likelihood of a migraine attack causing a stroke is very remote. That is not to say that migraine sufferers cannot have a stroke associated with their migraines. In persons under age 33, the most common associated factor for stroke is migraine headache. But over the course of a person's normal life span, the occurrence of migraine headache may actually be associated with a reduced risk of dying from cerebrovascular disease due to stroke.  Q: What are acute medications for migraine? A: Acute medications are used  to treat the pain of the headache after it has started. Examples over-the-counter medications, NSAIDs, ergots, and triptans.  Q: What are the triptans? A: Triptans are the newest class of abortive medications. They are  specifically targeted to treat migraine. Triptans are vasoconstrictors. They moderate some chemical reactions in the brain. The triptans work on receptors in your brain. Triptans help to restore the balance of a neurotransmitter called serotonin. Fluctuations in levels of serotonin are thought to be a main cause of migraine.  Q: Are over-the-counter medications for migraine effective? A: Over-the-counter, or "OTC," medications may be effective in relieving mild to moderate pain and associated symptoms of migraine. But you should see your caregiver before beginning any treatment regimen for migraine.  Q: What are preventive medications for migraine? A: Preventive medications for migraine are sometimes referred to as "prophylactic" treatments. They are used to reduce the frequency, severity, and length of migraine attacks. Examples of preventive medications include antiepileptic medications, antidepressants, beta-blockers, calcium channel blockers, and NSAIDs (nonsteroidal anti-inflammatory drugs). Q: Why are anticonvulsants used to treat migraine? A: During the past few years, there has been an increased interest in antiepileptic drugs for the prevention of migraine. They are sometimes referred to as "anticonvulsants". Both epilepsy and migraine may be caused by similar reactions in the brain.  Q: Why are antidepressants used to treat migraine? A: Antidepressants are typically used to treat people with depression. They may reduce migraine frequency by regulating chemical levels, such as serotonin, in the brain.  Q: What alternative therapies are used to treat migraine? A: The term "alternative therapies" is often used to describe treatments considered outside the scope of conventional Western medicine. Examples of alternative therapy include acupuncture, acupressure, and yoga. Another common alternative treatment is herbal therapy. Some herbs are believed to relieve headache pain. Always discuss alternative  therapies with your caregiver before proceeding. Some herbal products contain arsenic and other toxins. TENSION HEADACHES Q: What is a tension-type headache? What causes it? How can I treat it? A: Tension-type headaches occur randomly. They are often the result of temporary stress, anxiety, fatigue, or anger. Symptoms include soreness in your temples, a tightening band-like sensation around your head (a "vice-like" ache). Symptoms can also include a pulling feeling, pressure sensations, and contracting head and neck muscles. The headache begins in your forehead, temples, or the back of your head and neck. Treatment for tension-type headache may include over-the-counter or prescription medications. Treatment may also include self-help techniques such as relaxation training and biofeedback. CLUSTER HEADACHES Q: What is a cluster headache? What causes it? How can I treat it? A: Cluster headache gets its name because the attacks come in groups. The pain arrives with little, if any, warning. It is usually on one side of the head. A tearing or bloodshot eye and a runny nose on the same side of the headache may also accompany the pain. Cluster headaches are believed to be caused by chemical reactions in the brain. They have been described as the most severe and intense of any headache type. Treatment for cluster headache includes prescription medication and oxygen. SINUS HEADACHES Q: What is a sinus headache? What causes it? How can I treat it? A: When a cavity in the bones of the face and skull (a sinus) becomes inflamed, the inflammation will cause localized pain. This condition is usually the result of an allergic reaction, a tumor, or an infection. If your headache is caused by a sinus blockage, such as an infection, you will probably  have a fever. An x-ray will confirm a sinus blockage. Your caregiver's treatment might include antibiotics for the infection, as well as antihistamines or decongestants.    REBOUND HEADACHES Q: What is a rebound headache? What causes it? How can I treat it? A: A pattern of taking acute headache medications too often can lead to a condition known as "rebound headache." A pattern of taking too much headache medication includes taking it more than 2 days per week or in excessive amounts. That means more than the label or a caregiver advises. With rebound headaches, your medications not only stop relieving pain, they actually begin to cause headaches. Doctors treat rebound headache by tapering the medication that is being overused. Sometimes your caregiver will gradually substitute a different type of treatment or medication. Stopping may be a challenge. Regularly overusing a medication increases the potential for serious side effects. Consult a caregiver if you regularly use headache medications more than 2 days per week or more than the label advises. ADDITIONAL QUESTIONS AND ANSWERS Q: What is biofeedback? A: Biofeedback is a self-help treatment. Biofeedback uses special equipment to monitor your body's involuntary physical responses. Biofeedback monitors:  Breathing.  Pulse.  Heart rate.  Temperature.  Muscle tension.  Brain activity. Biofeedback helps you refine and perfect your relaxation exercises. You learn to control the physical responses that are related to stress. Once the technique has been mastered, you do not need the equipment any more. Q: Are headaches hereditary? A: Four out of five (80%) of people that suffer report a family history of migraine. Scientists are not sure if this is genetic or a family predisposition. Despite the uncertainty, a child has a 50% chance of having migraine if one parent suffers. The child has a 75% chance if both parents suffer.  Q: Can children get headaches? A: By the time they reach high school, most young people have experienced some type of headache. Many safe and effective approaches or medications can prevent a  headache from occurring or stop it after it has begun.  Q: What type of doctor should I see to diagnose and treat my headache? A: Start with your primary caregiver. Discuss his or her experience and approach to headaches. Discuss methods of classification, diagnosis, and treatment. Your caregiver may decide to recommend you to a headache specialist, depending upon your symptoms or other physical conditions. Having diabetes, allergies, etc., may require a more comprehensive and inclusive approach to your headache. The National Head[employer] Foundation will provide, upon request, a list of [facility] physician members in your state. Document Released: 08/18/2003 Document Revised: 08/20/2011 Document Reviewed: 01/26/2008 [facility] Patient Information 2015 [place], [place]. This information is not intended to replace advice given to you by your health care provider. Make sure you discuss any questions you have with your health care provider.

## 2014-04-08 ENCOUNTER — Encounter (HOSPITAL_COMMUNITY): Payer: Self-pay | Admitting: Emergency Medicine

## 2014-04-08 ENCOUNTER — Emergency Department (HOSPITAL_COMMUNITY)
Admission: EM | Admit: 2014-04-08 | Discharge: 2014-04-08 | Disposition: A | Discharge status: Home / Self Care | Attending: Emergency Medicine | Admitting: Emergency Medicine

## 2014-04-08 ENCOUNTER — Emergency Department (HOSPITAL_COMMUNITY)

## 2014-04-08 DIAGNOSIS — G8929 Other chronic pain: Secondary | ICD-10-CM | POA: Diagnosis not present

## 2014-04-08 DIAGNOSIS — R51 Headache: Secondary | ICD-10-CM | POA: Diagnosis not present

## 2014-04-08 DIAGNOSIS — Y9289 Other specified places as the place of occurrence of the external cause: Secondary | ICD-10-CM | POA: Diagnosis not present

## 2014-04-08 DIAGNOSIS — Z8679 Personal history of other diseases of the circulatory system: Secondary | ICD-10-CM | POA: Diagnosis not present

## 2014-04-08 DIAGNOSIS — Y9389 Activity, other specified: Secondary | ICD-10-CM | POA: Insufficient documentation

## 2014-04-08 DIAGNOSIS — M25561 Pain in right knee: Secondary | ICD-10-CM

## 2014-04-08 DIAGNOSIS — S8391XA Sprain of unspecified site of right knee, initial encounter: Secondary | ICD-10-CM | POA: Insufficient documentation

## 2014-04-08 DIAGNOSIS — X58XXXA Exposure to other specified factors, initial encounter: Secondary | ICD-10-CM | POA: Insufficient documentation

## 2014-04-08 DIAGNOSIS — R52 Pain, unspecified: Secondary | ICD-10-CM

## 2014-04-08 DIAGNOSIS — Z87828 Personal history of other (healed) physical injury and trauma: Secondary | ICD-10-CM | POA: Diagnosis not present

## 2014-04-08 DIAGNOSIS — S8991XA Unspecified injury of right lower leg, initial encounter: Secondary | ICD-10-CM

## 2014-04-08 MED ORDER — HYDROCODONE-ACETAMINOPHEN 5-325 MG PO TABS: 1.0000 | ORAL_TABLET | Freq: Four times a day (QID) | ORAL | Status: DC | PRN

## 2014-04-08 MED ORDER — IBUPROFEN 800 MG PO TABS: 800.0000 mg | ORAL_TABLET | Freq: Three times a day (TID) | ORAL | Status: DC
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
## 2014-04-08 NOTE — ED Notes (Signed)
Patient c/o headache to the top of his head, states he was seen for same last week, given a migraine cocktail and requested to return if he continued to have problems. Patient also c/o right knee pain, patient has had this knee pain intermittently for 2 years and has been see multiple times for this problem in the past. Patient reports he was using ibuprofen at home with short term relief, also has taken Vicodin in the past but states it prevents him from working.

## 2014-06-16 ENCOUNTER — Emergency Department (HOSPITAL_COMMUNITY)
Admission: EM | Admit: 2014-06-16 | Discharge: 2014-06-16 | Disposition: A | Discharge status: Home / Self Care | Attending: Emergency Medicine | Admitting: Emergency Medicine

## 2014-06-16 ENCOUNTER — Encounter (HOSPITAL_COMMUNITY): Payer: Self-pay | Admitting: Emergency Medicine

## 2014-06-16 DIAGNOSIS — H6692 Otitis media, unspecified, left ear: Secondary | ICD-10-CM

## 2014-06-16 DIAGNOSIS — Z8679 Personal history of other diseases of the circulatory system: Secondary | ICD-10-CM | POA: Insufficient documentation

## 2014-06-16 DIAGNOSIS — Z791 Long term (current) use of non-steroidal anti-inflammatories (NSAID): Secondary | ICD-10-CM | POA: Diagnosis not present

## 2014-06-16 DIAGNOSIS — R110 Nausea: Secondary | ICD-10-CM | POA: Insufficient documentation

## 2014-06-16 DIAGNOSIS — H7292 Unspecified perforation of tympanic membrane, left ear: Secondary | ICD-10-CM | POA: Diagnosis not present

## 2014-06-16 DIAGNOSIS — R05 Cough: Secondary | ICD-10-CM | POA: Diagnosis not present

## 2014-06-16 DIAGNOSIS — R0981 Nasal congestion: Secondary | ICD-10-CM | POA: Insufficient documentation

## 2014-06-16 DIAGNOSIS — Z8781 Personal history of (healed) traumatic fracture: Secondary | ICD-10-CM | POA: Insufficient documentation

## 2014-06-16 DIAGNOSIS — H9202 Otalgia, left ear: Secondary | ICD-10-CM | POA: Diagnosis present

## 2014-06-16 MED ORDER — IBUPROFEN 200 MG PO TABS
400.0000 mg | ORAL_TABLET | Freq: Once | ORAL | Status: AC
  Administered 2014-06-16: 400 mg via ORAL
  Filled 2014-06-16: qty 2

## 2014-06-16 MED ORDER — AMOXICILLIN 500 MG PO CAPS: 500.0000 mg | ORAL_CAPSULE | Freq: Three times a day (TID) | ORAL | Status: DC

## 2014-06-16 MED ORDER — AMOXICILLIN 500 MG PO CAPS
1000.0000 mg | ORAL_CAPSULE | Freq: Once | ORAL | Status: AC
  Administered 2014-06-16: 1000 mg via ORAL
  Filled 2014-06-16: qty 2

## 2014-06-16 NOTE — ED Provider Notes (Signed)
CSN: [id number]     Arrival date & time 06/16/14  2100 History   First MD Initiated Contact with Patient 06/16/14 2136     Chief Complaint  Patient presents with  . Otalgia     (Consider location/radiation/quality/duration/timing/severity/associated sxs/prior Treatment) Patient is a 19 y.o. male presenting with ear pain. The history is provided by the patient.  Otalgia Associated symptoms: congestion and cough   Associated symptoms: no fever, no headaches and no vomiting   pt c/o left ear pain for the past 3-4 days. Constant. Dull. Non radiating. No specific exacerbating or alleviating factors. Had sense of muffled hearing. Also notes recent mild upper resp and nasal congestion. occ non prod cough. Denies fever or chills. No headache or neck pain/stiffness. Pt denies drainage from ear. Denies inserting anything into ear. No trauma to ear. Denies hx ear infection.      Past Medical History  Diagnosis Date  . Meniscus tear   . Migraines    Past Surgical History  Procedure Laterality Date  . Fracture surgery Right     thumb   Family History  Problem Relation Age of Onset  . Diabetes Other   . Hypertension Other    History  Substance Use Topics  . Smoking status: Never Smoker   . Smokeless tobacco: Never Used  . Alcohol Use: No    Review of Systems  Constitutional: Negative for fever and chills.  HENT: Positive for congestion and ear pain. Negative for trouble swallowing and voice change.   Eyes: Negative for pain, discharge and redness.  Respiratory: Positive for cough. Negative for shortness of breath.   Cardiovascular: Negative for chest pain.  Gastrointestinal: Positive for nausea. Negative for vomiting.  Endocrine: Negative for polyuria.  Neurological: Negative for dizziness and headaches.      Allergies  Review of patient's allergies indicates no known allergies.  Home Medications   Prior to Admission medications   Medication Sig Start Date End Date  Taking? Authorizing Provider  HYDROcodone-acetaminophen (NORCO/VICODIN) 5-325 MG per tablet Take 1 tablet by mouth every 6 (six) hours as needed for moderate pain. 04/08/14   Evelina Bucy, MD  ibuprofen (ADVIL,MOTRIN) 600 MG tablet Take 600 mg by mouth every 6 (six) hours as needed for moderate pain (pain).     Historical Provider, MD  ibuprofen (ADVIL,MOTRIN) 800 MG tablet Take 1 tablet (800 mg total) by mouth 3 (three) times daily. 04/08/14   Evelina Bucy, MD   BP 128/76 mmHg  Pulse 90  Temp(Src) 98 F (36.7 C) (Oral)  Resp 16  Ht 6\' 3"  (1.905 m)  Wt 230 lb (104.327 kg)  BMI 28.75 kg/m2  SpO2 97% Physical Exam  Constitutional: He is oriented to person, place, and time. He appears well-developed and well-nourished. No distress.  HENT:  Head: Atraumatic.  Mouth/Throat: Oropharynx is clear and moist.  Mild nasal congestion. Right eac clear, tm normal.  Left eac w purulent drainage, eac not swollen, tender, or erythematous. Small tm perf. No mastoid tenderness.   Eyes: Conjunctivae are normal. Pupils are equal, round, and reactive to light. No scleral icterus.  Neck: Neck supple. No tracheal deviation present.  No stiffness or rigidity  Cardiovascular: Normal rate, regular rhythm, normal heart sounds and intact distal pulses.   No murmur heard. Pulmonary/Chest: Effort normal and breath sounds normal. No accessory muscle usage. No respiratory distress.  Abdominal: Soft. He exhibits no distension. There is no tenderness.  Musculoskeletal: Normal range of motion.  Lymphadenopathy:    He  has no cervical adenopathy.  Neurological: He is alert and oriented to person, place, and time.  Hearing grossly intact bil ears, states seems muffled on left.   Skin: Skin is warm and dry. No rash noted. He is not diaphoretic.  Psychiatric: He has a normal mood and affect.  Nursing note and vitals reviewed.   ED Course  Procedures (including critical care time) Labs Review   MDM   Confirmed  nkda w pt.  Amoxicillin rx.   ent follow up.   Discussed return precautions.  Reviewed nursing notes and prior charts for additional history.     Mirna Mires, MD 06/16/14 2147

## 2014-06-16 NOTE — ED Notes (Signed)
Pt states he has been having left ear pain and HA for the last three days. Pt also c/o generalized aches and pains.

## 2014-06-16 NOTE — Discharge Instructions (Signed)
It was our pleasure to provide your ER care today - we hope that you feel better.  Keep ear very clean and dry.  Do not stick anything into ear, including q tips.   Take amoxicillin (antibiotic) as prescribed.  Take tylenol/motrin as need.  Follow up with ent doctor in the coming week - see referral - call office tomorrow morning to arrange appointment time.  Return to ER if worse, new symptoms, severe ear pain, severe headache, change in hearing, severe dizziness, trouble breathing, other concern.     Otitis Media Otitis media is redness, soreness, and inflammation of the middle ear. Otitis media may be caused by allergies or, most commonly, by infection. Often it occurs as a complication of the common cold. SIGNS AND SYMPTOMS Symptoms of otitis media may include:  Earache.  Fever.  Ringing in your ear.  Headache.  Leakage of fluid from the ear. DIAGNOSIS To diagnose otitis media, your health care provider will examine your ear with an otoscope. This is an instrument that allows your health care provider to see into your ear in order to examine your eardrum. Your health care provider also will ask you questions about your symptoms. TREATMENT  Typically, otitis media resolves on its own within 3-5 days. Your health care provider may prescribe medicine to ease your symptoms of pain. If otitis media does not resolve within 5 days or is recurrent, your health care provider may prescribe antibiotic medicines if he or she suspects that a bacterial infection is the cause. HOME CARE INSTRUCTIONS   If you were prescribed an antibiotic medicine, finish it all even if you start to feel better.  Take medicines only as directed by your health care provider.  Keep all follow-up visits as directed by your health care provider. SEEK MEDICAL CARE IF:  You have otitis media only in one ear, or bleeding from your nose, or both.  You notice a lump on your neck.  You are not getting better  in 3-5 days.  You feel worse instead of better. SEEK IMMEDIATE MEDICAL CARE IF:   You have pain that is not controlled with medicine.  You have swelling, redness, or pain around your ear or stiffness in your neck.  You notice that part of your face is paralyzed.  You notice that the bone behind your ear (mastoid) is tender when you touch it. MAKE SURE YOU:   Understand these instructions.  Will watch your condition.  Will get help right away if you are not doing well or get worse. Document Released: 03/02/2004 Document Revised: 10/12/2013 Document Reviewed: 12/23/2012 [facility] Patient Information 2015 [place], [place]. This information is not intended to replace advice given to you by your health care provider. Make sure you discuss any questions you have with your health care provider.       Eardrum Perforation The eardrum is a thin, round tissue inside the ear that separates the ear canal from the middle ear. This is the tissue that detects sound and enables you to hear. The eardrum can be punctured or torn (perforated). Eardrums generally heal without help and with little or no permanent hearing loss. CAUSES   Sudden pressure changes that happen in situations like scuba diving or flying in an airplane.  Foreign objects in the ear.  Inserting a cotton-tipped swab in the ear.  Loud noise.  Trauma to the ear. SYMPTOMS   Hearing loss.  Ear pain.  Ringing in the ears.  Discharge or bleeding from the  ear.  Dizziness.  Vomiting.  Facial paralysis. HOME CARE INSTRUCTIONS   Keep your ear dry, as this improves healing. Swimming, diving, and showers are not allowed until healing is complete. While bathing, protect the ear by placing a piece of cotton covered with petroleum jelly in the outer ear canal.  Only take over-the-counter or prescription medicines for pain, discomfort, or fever as directed by your caregiver.  Blow your nose gently. Forceful blowing  increases the pressure in the middle ear and may cause further injury or delay healing.  Resume normal activities, such as showering, when the perforation has healed. Your caregiver can let you know when this has occurred.  Talk to your caregiver before flying on an airplane. Air travel is generally allowed with a perforated eardrum.  If your caregiver has given you a follow-up appointment, it is very important to keep that appointment. Failure to keep the appointment could result in a chronic or permanent injury, pain, hearing loss, and disability. SEEK IMMEDIATE MEDICAL CARE IF:   You have bleeding or pus coming from your ear.  You have problems with balance, dizziness, nausea, or vomiting.  You develop increased pain.  You have a fever. MAKE SURE YOU:   Understand these instructions.  Will watch your condition.  Will get help right away if you are not doing well or get worse. Document Released: 05/25/2000 Document Revised: 08/20/2011 Document Reviewed: 05/27/2008 [employer] Patient Information 2015 [place], [place]. This information is not intended to replace advice given to you by your health care provider. Make sure you discuss any questions you have with your health care provider.

## 2014-06-18 ENCOUNTER — Emergency Department (HOSPITAL_COMMUNITY)
Admission: EM | Admit: 2014-06-18 | Discharge: 2014-06-18 | Disposition: A | Discharge status: Home / Self Care | Attending: Emergency Medicine | Admitting: Emergency Medicine

## 2014-06-18 ENCOUNTER — Encounter (HOSPITAL_COMMUNITY): Payer: Self-pay | Admitting: *Deleted

## 2014-06-18 ENCOUNTER — Emergency Department (HOSPITAL_COMMUNITY)

## 2014-06-18 DIAGNOSIS — R079 Chest pain, unspecified: Secondary | ICD-10-CM

## 2014-06-18 DIAGNOSIS — R0789 Other chest pain: Secondary | ICD-10-CM | POA: Diagnosis not present

## 2014-06-18 DIAGNOSIS — R05 Cough: Secondary | ICD-10-CM | POA: Insufficient documentation

## 2014-06-18 DIAGNOSIS — Z8679 Personal history of other diseases of the circulatory system: Secondary | ICD-10-CM | POA: Diagnosis not present

## 2014-06-18 DIAGNOSIS — R0602 Shortness of breath: Secondary | ICD-10-CM | POA: Diagnosis not present

## 2014-06-18 LAB — CBC WITH DIFFERENTIAL/PLATELET
Basophils Absolute: 0 10*3/uL (ref 0.0–0.1)
Basophils Relative: 1 % (ref 0–1)
Eosinophils Absolute: 0.4 10*3/uL (ref 0.0–0.7)
Eosinophils Relative: 8 % — ABNORMAL HIGH (ref 0–5)
HEMATOCRIT: 48.5 % (ref 39.0–52.0)
Hemoglobin: 15.9 g/dL (ref 13.0–17.0)
LYMPHS ABS: 2 10*3/uL (ref 0.7–4.0)
Lymphocytes Relative: 41 % (ref 12–46)
MCH: 29.4 pg (ref 26.0–34.0)
MCHC: 32.8 g/dL (ref 30.0–36.0)
MCV: 89.6 fL (ref 78.0–100.0)
MONO ABS: 0.6 10*3/uL (ref 0.1–1.0)
MONOS PCT: 11 % (ref 3–12)
NEUTROS ABS: 1.9 10*3/uL (ref 1.7–7.7)
Neutrophils Relative %: 39 % — ABNORMAL LOW (ref 43–77)
Platelets: 249 10*3/uL (ref 150–400)
RBC: 5.41 MIL/uL (ref 4.22–5.81)
RDW: 12.4 % (ref 11.5–15.5)
WBC: 4.8 10*3/uL (ref 4.0–10.5)

## 2014-06-18 LAB — BASIC METABOLIC PANEL
Anion gap: 8 (ref 5–15)
BUN: 11 mg/dL (ref 6–23)
CALCIUM: 9.2 mg/dL (ref 8.4–10.5)
CO2: 26 mmol/L (ref 19–32)
Chloride: 108 mEq/L (ref 96–112)
Creatinine, Ser: 1.06 mg/dL (ref 0.50–1.35)
Glucose, Bld: 101 mg/dL — ABNORMAL HIGH (ref 70–99)
Potassium: 3.7 mmol/L (ref 3.5–5.1)
SODIUM: 142 mmol/L (ref 135–145)

## 2014-06-18 LAB — I-STAT TROPONIN, ED: TROPONIN I, POC: 0 ng/mL (ref 0.00–0.08)

## 2014-06-18 MED ORDER — KETOROLAC TROMETHAMINE 30 MG/ML IJ SOLN
30.0000 mg | Freq: Once | INTRAMUSCULAR | Status: AC
  Administered 2014-06-18: 30 mg via INTRAVENOUS
  Filled 2014-06-18: qty 1

## 2014-06-18 MED ORDER — NAPROXEN 500 MG PO TABS: 500.0000 mg | ORAL_TABLET | Freq: Two times a day (BID) | ORAL | Status: DC

## 2014-06-18 NOTE — Discharge Instructions (Signed)

## 2014-06-18 NOTE — ED Provider Notes (Signed)
CSN: [id number]     Arrival date & time 06/18/14  1001 History   First MD Initiated Contact with Patient 06/18/14 1005     Chief Complaint  Patient presents with  . Chest Pain  . Shortness of Breath     (Consider location/radiation/quality/duration/timing/severity/associated sxs/prior Treatment) Patient is a 19 y.o. male presenting with chest pain and shortness of breath.  Chest Pain Pain location:  Substernal area Pain quality: sharp   Pain radiates to:  Does not radiate Pain severity:  Moderate Onset quality:  Sudden Duration: 2-3 days, intermittent lasting hours. Timing:  Intermittent Progression:  Waxing and waning Chronicity:  New Context: at rest   Relieved by:  Nothing Worsened by:  Coughing Ineffective treatments:  None tried Associated symptoms: cough and shortness of breath   Associated symptoms: no abdominal pain, no back pain, no dizziness, no dysphagia, no fatigue, no fever, no headache, no nausea, no numbness, not vomiting and no weakness   Associated symptoms comment:  Had URI symptoms earlier this week.  Risk factors: male sex   Risk factors: no aortic disease, no birth control, no coronary artery disease, no diabetes mellitus, no high cholesterol, no hypertension, no Marfan's syndrome, not obese, no prior DVT/PE, no smoking and no surgery   Shortness of Breath Associated symptoms: chest pain and cough   Associated symptoms: no abdominal pain, no fever, no headaches, no rash and no vomiting     Past Medical History  Diagnosis Date  . Meniscus tear   . Migraines    Past Surgical History  Procedure Laterality Date  . Fracture surgery Right     thumb   Family History  Problem Relation Age of Onset  . Diabetes Other   . Hypertension Other    History  Substance Use Topics  . Smoking status: Never Smoker   . Smokeless tobacco: Never Used  . Alcohol Use: No    Review of Systems  Constitutional: Negative for fever, activity change, appetite change and  fatigue.  HENT: Negative for congestion, facial swelling, rhinorrhea and trouble swallowing.   Eyes: Negative for photophobia and pain.  Respiratory: Positive for cough and shortness of breath. Negative for chest tightness.   Cardiovascular: Positive for chest pain. Negative for leg swelling.  Gastrointestinal: Negative for nausea, vomiting, abdominal pain, diarrhea and constipation.  Endocrine: Negative for polydipsia and polyuria.  Genitourinary: Negative for dysuria, urgency, decreased urine volume and difficulty urinating.  Musculoskeletal: Negative for back pain and gait problem.  Skin: Negative for color change, rash and wound.  Allergic/Immunologic: Negative for immunocompromised state.  Neurological: Negative for dizziness, facial asymmetry, speech difficulty, weakness, numbness and headaches.  Psychiatric/Behavioral: Negative for confusion, decreased concentration and agitation.      Allergies  Review of patient's allergies indicates no known allergies.  Home Medications   Prior to Admission medications   Medication Sig Start Date End Date Taking? Authorizing Provider  ibuprofen (ADVIL,MOTRIN) 600 MG tablet Take 600 mg by mouth every 6 (six) hours as needed for moderate pain (pain).    Yes Historical Provider, MD  amoxicillin (AMOXIL) 500 MG capsule Take 1 capsule (500 mg total) by mouth 3 (three) times daily. 06/16/14   Mirna Mires, MD  HYDROcodone-acetaminophen (NORCO/VICODIN) 5-325 MG per tablet Take 1 tablet by mouth every 6 (six) hours as needed for moderate pain. Patient not taking: Reported on 06/18/2014 04/08/14   Evelina Bucy, MD  ibuprofen (ADVIL,MOTRIN) 800 MG tablet Take 1 tablet (800 mg total) by mouth 3 (three)  times daily. Patient not taking: Reported on 06/18/2014 04/08/14   Evelina Bucy, MD   BP 136/87 mmHg  Pulse 76  Temp(Src) 98.1 F (36.7 C) (Oral)  Resp 18  SpO2 100% Physical Exam  Constitutional: He is oriented to person, place, and time. He appears  well-developed and well-nourished. No distress.  HENT:  Head: Normocephalic and atraumatic.  Mouth/Throat: No oropharyngeal exudate.  Eyes: Pupils are equal, round, and reactive to light.  Neck: Normal range of motion. Neck supple.  Cardiovascular: Normal rate, regular rhythm and normal heart sounds.  Exam reveals no gallop and no friction rub.   No murmur heard. Pulmonary/Chest: Effort normal and breath sounds normal. No respiratory distress. He has no wheezes. He has no rales.  Abdominal: Soft. Bowel sounds are normal. He exhibits no distension and no mass. There is no tenderness. There is no rebound and no guarding.  Musculoskeletal: Normal range of motion. He exhibits no edema or tenderness.  Neurological: He is alert and oriented to person, place, and time.  Skin: Skin is warm and dry.  Psychiatric: He has a normal mood and affect.    ED Course  Procedures (including critical care time) Labs Review Labs Reviewed  CBC WITH DIFFERENTIAL  BASIC METABOLIC PANEL  I-STAT [place], ED    Imaging Review No results found.   EKG Interpretation   Date/Time:  Friday June 18 2014 10:11:27 EST Ventricular Rate:  67 PR Interval:  158 QRS Duration: 86 QT Interval:  370 QTC Calculation: 390 R Axis:   63 Text Interpretation:  Sinus rhythm J point elevation throughout No prior  for comparison Confirmed by DOCHERTY  MD, MEGAN ([id number]) on 06/18/2014  10:19:38 AM      MDM   Final diagnoses:  Chest pain    Pt is a 19 y.o. male with Pmhx as above who presents with 2-3 days of SOB, and sharp, central, intermittent chest pains. On PE, VSS, pt in NAD. EKG w j point elevation, nml rate.  PERC negative. On PE, VSS, pt in NAD, CP not reproducible, lungs clear, No LE pain or edema.    CBC, BMP noncontributory, Trop not elevated. CXR.  Doubt ACS, PE, and feel CP like related to recent URI, likely costochondral pain or pleurisy. Rec scheduled NSAIDS   Ellender Hose evaluation in the  Emergency Department is complete. It has been determined that no acute conditions requiring further emergency intervention are present at this time. The patient/guardian have been advised of the diagnosis and plan. We have discussed signs and symptoms that warrant return to the ED, such as changes or worsening in symptoms, worsening pain, shortness of breath, leg swelling, fever.    Ernestina Patches, MD 06/18/14 1714

## 2014-06-18 NOTE — ED Notes (Signed)
Patient says for the last 2-3 days he's been having shortness of breath and sharp pains to his central chest that come and go. He denies any previous chest pain or medical history prior.

## 2014-08-02 ENCOUNTER — Emergency Department (HOSPITAL_COMMUNITY)
Admission: EM | Admit: 2014-08-02 | Discharge: 2014-08-02 | Disposition: A | Discharge status: Home / Self Care | Attending: Emergency Medicine | Admitting: Emergency Medicine

## 2014-08-02 ENCOUNTER — Encounter (HOSPITAL_COMMUNITY): Payer: Self-pay | Admitting: Emergency Medicine

## 2014-08-02 DIAGNOSIS — Z792 Long term (current) use of antibiotics: Secondary | ICD-10-CM | POA: Diagnosis not present

## 2014-08-02 DIAGNOSIS — J028 Acute pharyngitis due to other specified organisms: Secondary | ICD-10-CM

## 2014-08-02 DIAGNOSIS — Z8679 Personal history of other diseases of the circulatory system: Secondary | ICD-10-CM | POA: Insufficient documentation

## 2014-08-02 DIAGNOSIS — R5383 Other fatigue: Secondary | ICD-10-CM | POA: Insufficient documentation

## 2014-08-02 DIAGNOSIS — Z79899 Other long term (current) drug therapy: Secondary | ICD-10-CM | POA: Diagnosis not present

## 2014-08-02 DIAGNOSIS — J029 Acute pharyngitis, unspecified: Secondary | ICD-10-CM | POA: Diagnosis present

## 2014-08-02 DIAGNOSIS — M791 Myalgia: Secondary | ICD-10-CM | POA: Insufficient documentation

## 2014-08-02 DIAGNOSIS — Z791 Long term (current) use of non-steroidal anti-inflammatories (NSAID): Secondary | ICD-10-CM | POA: Diagnosis not present

## 2014-08-02 DIAGNOSIS — B9789 Other viral agents as the cause of diseases classified elsewhere: Secondary | ICD-10-CM

## 2014-08-02 LAB — RAPID STREP SCREEN (MED CTR MEBANE ONLY): Streptococcus, Group A Screen (Direct): NEGATIVE

## 2014-08-02 MED ORDER — PHENOL 1.4 % MT LIQD: 1.0000 | OROMUCOSAL | Status: DC | PRN

## 2014-08-02 MED ORDER — IBUPROFEN 600 MG PO TABS: 600.0000 mg | ORAL_TABLET | Freq: Four times a day (QID) | ORAL | Status: DC | PRN

## 2014-08-02 MED ORDER — SALINE SPRAY 0.65 % NA SOLN: 2.0000 | NASAL | Status: DC | PRN

## 2014-08-02 NOTE — ED Notes (Signed)
Body aches, chills, fatigue, and throat pain starting yesterday. Denies getting flu shot this season. Tonsils 2+, pink not red, no white exudate visible.

## 2014-08-02 NOTE — ED Provider Notes (Signed)
CSN: [id number]     Arrival date & time 08/02/14  1105 History  This chart was scribed for Noland Fordyce, PA-C, working with Dorie Rank, MD by Marti Sleigh, ED Scribe. This patient was seen in room WTR5/WTR5 and the patient's care was started at 12:15 PM.    Chief Complaint  Patient presents with  . Sore Throat  . Generalized Body Aches   HPI  HPI Comments: Bradley Larson is a 19 y.o. male who presents to the Emergency Department complaining of sore throat, with associated myalgias, chills, and fatigue for the last two days.  Pt has taken robitussin PTA. Pt states his sore throat pain is 7/10. Pt denies CP, abdominal pain or trouble breathing. Pt has not had a flu shot this year. Pt has not traveled recently. Pt does not have a PCP.   Past Medical History  Diagnosis Date  . Meniscus tear   . Migraines    Past Surgical History  Procedure Laterality Date  . Fracture surgery Right     thumb   Family History  Problem Relation Age of Onset  . Diabetes Other   . Hypertension Other    History  Substance Use Topics  . Smoking status: Never Smoker   . Smokeless tobacco: Never Used  . Alcohol Use: No    Review of Systems  Constitutional: Positive for chills and fatigue.  HENT: Positive for sore throat.   Cardiovascular: Negative for chest pain.  Gastrointestinal: Negative for abdominal pain.  Musculoskeletal: Positive for myalgias.  All other systems reviewed and are negative.   Allergies  Review of patient's allergies indicates no known allergies.  Home Medications   Prior to Admission medications   Medication Sig Start Date End Date Taking? Authorizing Provider  amoxicillin (AMOXIL) 500 MG capsule Take 1 capsule (500 mg total) by mouth 3 (three) times daily. 06/16/14   Mirna Mires, MD  HYDROcodone-acetaminophen (NORCO/VICODIN) 5-325 MG per tablet Take 1 tablet by mouth every 6 (six) hours as needed for moderate pain. Patient not taking: Reported on 06/18/2014 04/08/14    Evelina Bucy, MD  ibuprofen (ADVIL,MOTRIN) 600 MG tablet Take 1 tablet (600 mg total) by mouth every 6 (six) hours as needed for moderate pain (pain). 08/02/14   Noland Fordyce, PA-C  ibuprofen (ADVIL,MOTRIN) 800 MG tablet Take 1 tablet (800 mg total) by mouth 3 (three) times daily. Patient not taking: Reported on 06/18/2014 04/08/14   Evelina Bucy, MD  naproxen (NAPROSYN) 500 MG tablet Take 1 tablet (500 mg total) by mouth 2 (two) times daily with a meal. 06/18/14   Ernestina Patches, MD  phenol (CHLORASEPTIC) 1.4 % LIQD Use as directed 1 spray in the mouth or throat as needed for throat irritation / pain. 08/02/14   Noland Fordyce, PA-C  sodium chloride (OCEAN) 0.65 % SOLN nasal spray Place 2 sprays into both nostrils as needed for congestion. 08/02/14   Noland Fordyce, PA-C   BP 127/80 mmHg  Pulse 92  Temp(Src) 98.2 F (36.8 C) (Oral)  Resp 20  SpO2 97% Physical Exam  Constitutional: He is oriented to person, place, and time. He appears well-developed and well-nourished. No distress.  HENT:  Head: Normocephalic and atraumatic.  Right Ear: Hearing, tympanic membrane, external ear and ear canal normal.  Left Ear: Hearing, tympanic membrane, external ear and ear canal normal.  Nose: Mucosal edema present.  Mouth/Throat: Uvula is midline. Posterior oropharyngeal edema present. No oropharyngeal exudate.  Nose edematous. Tonsils with erythema and edema 2+.  Eyes: Pupils are equal, round, and reactive to light.  Neck: Normal range of motion. Neck supple.  Cardiovascular: Normal rate.   Pulmonary/Chest: Effort normal. No respiratory distress.  Musculoskeletal: Normal range of motion.  Neurological: He is alert and oriented to person, place, and time. Coordination normal.  Skin: Skin is warm and dry. He is not diaphoretic.  Psychiatric: He has a normal mood and affect. His behavior is normal.  Nursing note and vitals reviewed.   ED Course  Procedures  DIAGNOSTIC STUDIES: Oxygen Saturation is 97% on  RA, normal by my interpretation.    COORDINATION OF CARE: 12:19 PM Discussed treatment plan with pt at bedside and pt agreed to plan.  Labs Review Labs Reviewed  RAPID STREP SCREEN  CULTURE, GROUP A STREP    Imaging Review No results found.   EKG Interpretation None     Results for orders placed or performed during the hospital encounter of 08/02/14  Rapid strep screen   (If patient has fever and/or without cough or runny nose)  Result Value Ref Range   Streptococcus, Group A Screen (Direct) NEGATIVE NEGATIVE    MDM   Final diagnoses:  Viral sore throat   Rapid strep: negative. No evidence of tonsillar abscess. Will tx symptomatically, no indication for antibiotics at this time. Advised to f/u with PCP in 3-4 days for recheck of symptoms if not improving. Return precautions provided. Pt verbalized understanding and agreement with tx plan.   I personally performed the services described in this documentation, which was scribed in my presence. The recorded information has been reviewed and is accurate.    Noland Fordyce, PA-C 08/02/14 1315  Dorie Rank, MD 08/02/14 [phone]

## 2014-08-05 LAB — CULTURE, GROUP A STREP

## 2014-09-30 ENCOUNTER — Emergency Department (HOSPITAL_COMMUNITY)
Admission: EM | Admit: 2014-09-30 | Discharge: 2014-09-30 | Disposition: A | Discharge status: Home / Self Care | Attending: Emergency Medicine | Admitting: Emergency Medicine

## 2014-09-30 ENCOUNTER — Encounter (HOSPITAL_COMMUNITY): Payer: Self-pay | Admitting: Emergency Medicine

## 2014-09-30 DIAGNOSIS — B001 Herpesviral vesicular dermatitis: Secondary | ICD-10-CM | POA: Diagnosis not present

## 2014-09-30 DIAGNOSIS — K1379 Other lesions of oral mucosa: Secondary | ICD-10-CM | POA: Diagnosis present

## 2014-09-30 DIAGNOSIS — Z792 Long term (current) use of antibiotics: Secondary | ICD-10-CM | POA: Insufficient documentation

## 2014-09-30 DIAGNOSIS — K120 Recurrent oral aphthae: Secondary | ICD-10-CM

## 2014-09-30 DIAGNOSIS — Z87828 Personal history of other (healed) physical injury and trauma: Secondary | ICD-10-CM | POA: Diagnosis not present

## 2014-09-30 DIAGNOSIS — Z8679 Personal history of other diseases of the circulatory system: Secondary | ICD-10-CM | POA: Diagnosis not present

## 2014-09-30 DIAGNOSIS — Z791 Long term (current) use of non-steroidal anti-inflammatories (NSAID): Secondary | ICD-10-CM | POA: Diagnosis not present

## 2014-09-30 MED ORDER — MAGIC MOUTHWASH W/LIDOCAINE: 5.0000 mL | Freq: Four times a day (QID) | ORAL | Status: DC | PRN

## 2014-09-30 NOTE — ED Notes (Signed)
Pt states that he has pain and raised areas on his lip x 2 days. Alert and oriented.

## 2014-09-30 NOTE — ED Provider Notes (Signed)
CSN: [id number]     Arrival date & time 09/30/14  0142 History   First MD Initiated Contact with Patient 09/30/14 0215     Chief Complaint  Patient presents with  . Oral Swelling     (Consider location/radiation/quality/duration/timing/severity/associated sxs/prior Treatment) The history is provided by the patient and medical records.    19 year old male with history of migraines here with painful lesions on his lower lip for the past 2 days. He states he's had these before and thinks it is a recurrence of cold sores. He does admit to increased stress recently but denies recent illness. He has no other bodily rashes or mucosal lesions. No fever or chills. No difficulty swallowing or speaking.  No recent abx use.  States he tried OTC topical ointment PTA without relief.  VSS.  Past Medical History  Diagnosis Date  . Meniscus tear   . Migraines    Past Surgical History  Procedure Laterality Date  . Fracture surgery Right     thumb   Family History  Problem Relation Age of Onset  . Diabetes Other   . Hypertension Other    History  Substance Use Topics  . Smoking status: Never Smoker   . Smokeless tobacco: Never Used  . Alcohol Use: No    Review of Systems  HENT: Positive for mouth sores.   All other systems reviewed and are negative.     Allergies  Review of patient's allergies indicates no known allergies.  Home Medications   Prior to Admission medications   Medication Sig Start Date End Date Taking? Authorizing Provider  amoxicillin (AMOXIL) 500 MG capsule Take 1 capsule (500 mg total) by mouth 3 (three) times daily. 06/16/14   Lajean Saver, MD  HYDROcodone-acetaminophen (NORCO/VICODIN) 5-325 MG per tablet Take 1 tablet by mouth every 6 (six) hours as needed for moderate pain. Patient not taking: Reported on 06/18/2014 04/08/14   Evelina Bucy, MD  ibuprofen (ADVIL,MOTRIN) 600 MG tablet Take 1 tablet (600 mg total) by mouth every 6 (six) hours as needed for moderate  pain (pain). 08/02/14   Noland Fordyce, PA-C  ibuprofen (ADVIL,MOTRIN) 800 MG tablet Take 1 tablet (800 mg total) by mouth 3 (three) times daily. Patient not taking: Reported on 06/18/2014 04/08/14   Evelina Bucy, MD  naproxen (NAPROSYN) 500 MG tablet Take 1 tablet (500 mg total) by mouth 2 (two) times daily with a meal. 06/18/14   Ernestina Patches, MD  phenol (CHLORASEPTIC) 1.4 % LIQD Use as directed 1 spray in the mouth or throat as needed for throat irritation / pain. 08/02/14   Noland Fordyce, PA-C  sodium chloride (OCEAN) 0.65 % SOLN nasal spray Place 2 sprays into both nostrils as needed for congestion. 08/02/14   Noland Fordyce, PA-C   BP 147/80 mmHg  Pulse 87  Temp(Src) 98.6 F (37 C) (Oral)  Resp 16  SpO2 97%   Physical Exam  Constitutional: He is oriented to person, place, and time. He appears well-developed and well-nourished.  HENT:  Head: Normocephalic and atraumatic.  Mouth/Throat: Oropharynx is clear and moist.  2 very small cold sores noted to right lower lip that appear to be healing, small aphthous ulcer noted to right lower inner lip; no noted lip or tongue swelling, handling secretions well, no difficulty swallowing or speaking  Eyes: Conjunctivae and EOM are normal. Pupils are equal, round, and reactive to light.  Neck: Normal range of motion.  Cardiovascular: Normal rate, regular rhythm and normal heart sounds.   Pulmonary/Chest:  Effort normal and breath sounds normal.  Abdominal: Soft. Bowel sounds are normal.  Musculoskeletal: Normal range of motion.  Neurological: He is alert and oriented to person, place, and time.  Skin: Skin is warm and dry.  Psychiatric: He has a normal mood and affect.  Nursing note and vitals reviewed.   ED Course  Procedures (including critical care time) Labs Review Labs Reviewed - No data to display  Imaging Review No results found.   EKG Interpretation None      MDM   Final diagnoses:  Cold sore  Aphthous ulcer   19 year old  male with 2 very small cold sores noted to right lower lip as well as a small aphthous ulcer of his right lower inner lip. There is no appreciable lip or tongue swelling, no signs of necrosis. No recent antibiotics.  Do not feel this is steven's johnson syndrome.  Patient will be d/c home with magic mouthwash, instructed to continue OTC treatment for cold sore.  FU with PCP.  Discussed plan with patient, he/she acknowledged understanding and agreed with plan of care.  Return precautions given for new or worsening symptoms.  Larene Pickett, PA-C 09/30/14 0252  Shanon Rosser, MD 09/30/14 [phone]

## 2014-09-30 NOTE — Discharge Instructions (Signed)
Take the prescribed medication as directed, recommend swish and spit.  Continue using over the counter topical ointment for cold sores (abbreva is a good one). Follow-up with your primary care physician. Return to the ED for new or worsening symptoms.

## 2014-11-01 ENCOUNTER — Emergency Department (HOSPITAL_COMMUNITY)
Admission: EM | Admit: 2014-11-01 | Discharge: 2014-11-01 | Disposition: A | Discharge status: Home / Self Care | Attending: Emergency Medicine | Admitting: Emergency Medicine

## 2014-11-01 ENCOUNTER — Encounter (HOSPITAL_COMMUNITY): Payer: Self-pay | Admitting: Emergency Medicine

## 2014-11-01 ENCOUNTER — Emergency Department (HOSPITAL_COMMUNITY)

## 2014-11-01 DIAGNOSIS — Z87828 Personal history of other (healed) physical injury and trauma: Secondary | ICD-10-CM | POA: Insufficient documentation

## 2014-11-01 DIAGNOSIS — R0789 Other chest pain: Secondary | ICD-10-CM | POA: Diagnosis not present

## 2014-11-01 DIAGNOSIS — Z79899 Other long term (current) drug therapy: Secondary | ICD-10-CM | POA: Diagnosis not present

## 2014-11-01 DIAGNOSIS — Z8679 Personal history of other diseases of the circulatory system: Secondary | ICD-10-CM | POA: Insufficient documentation

## 2014-11-01 DIAGNOSIS — R079 Chest pain, unspecified: Secondary | ICD-10-CM | POA: Diagnosis present

## 2014-11-01 LAB — BASIC METABOLIC PANEL
ANION GAP: 9 (ref 5–15)
BUN: 15 mg/dL (ref 6–20)
CALCIUM: 9.2 mg/dL (ref 8.9–10.3)
CHLORIDE: 107 mmol/L (ref 101–111)
CO2: 25 mmol/L (ref 22–32)
CREATININE: 1.16 mg/dL (ref 0.61–1.24)
GFR calc non Af Amer: >60 mL/min (ref >60)
Glucose, Bld: 112 mg/dL — ABNORMAL HIGH (ref 65–99)
Potassium: 3.7 mmol/L (ref 3.5–5.1)
Sodium: 141 mmol/L (ref 135–145)

## 2014-11-01 LAB — I-STAT TROPONIN, ED: TROPONIN I, POC: 0 ng/mL (ref 0.00–0.08)

## 2014-11-01 LAB — CBC
HEMATOCRIT: 47.5 % (ref 39.0–52.0)
Hemoglobin: 15.9 g/dL (ref 13.0–17.0)
MCH: 30.2 pg (ref 26.0–34.0)
MCHC: 33.5 g/dL (ref 30.0–36.0)
MCV: 90.1 fL (ref 78.0–100.0)
Platelets: 243 10*3/uL (ref 150–400)
RBC: 5.27 MIL/uL (ref 4.22–5.81)
RDW: 12.5 % (ref 11.5–15.5)
WBC: 7.7 10*3/uL (ref 4.0–10.5)

## 2014-11-01 MED ORDER — IBUPROFEN 800 MG PO TABS: 800.0000 mg | ORAL_TABLET | Freq: Three times a day (TID) | ORAL | Status: DC

## 2014-11-01 MED ORDER — IBUPROFEN 800 MG PO TABS
800.0000 mg | ORAL_TABLET | Freq: Once | ORAL | Status: AC
  Administered 2014-11-01: 800 mg via ORAL
  Filled 2014-11-01: qty 1

## 2014-11-01 NOTE — ED Notes (Signed)
Questions, concerns r/t denied pt ambulatory and a&ox4

## 2014-11-01 NOTE — ED Notes (Signed)
Pt c/o sharp central chest pain onset yesterday, pain increases with inspiration. Pt denies SOB, dizziness, blurred vision.

## 2014-11-01 NOTE — Discharge Instructions (Signed)
Chest Wall Pain °Chest wall pain is pain felt in or around the chest bones and muscles. It may take up to 6 weeks to get better. It may take longer if you are active. Chest wall pain can happen on its own. Other times, things like germs, injury, coughing, or exercise can cause the pain. °HOME CARE  °· Avoid activities that make you tired or cause pain. Try not to use your chest, belly (abdominal), or side muscles. Do not use heavy weights. °· Put ice on the sore area. °¨ Put ice in a plastic bag. °¨ Place a towel between your skin and the bag. °¨ Leave the ice on for 15-20 minutes for the first 2 days. °· Only take medicine as told by your doctor. °GET HELP RIGHT AWAY IF:  °· You have more pain or are very uncomfortable. °· You have a fever. °· Your chest pain gets worse. °· You have new problems. °· You feel sick to your stomach (nauseous) or throw up (vomit). °· You start to sweat or feel lightheaded. °· You have a cough with mucus (phlegm). °· You cough up blood. °MAKE SURE YOU:  °· Understand these instructions. °· Will watch your condition. °· Will get help right away if you are not doing well or get worse. °Document Released: 11/14/2007 Document Revised: 08/20/2011 Document Reviewed: 01/22/2011 °[employer]® Patient Information ©2015 [employer]. This information is not intended to replace advice given to you by your health care provider. Make sure you discuss any questions you have with your health care provider. ° °

## 2014-11-01 NOTE — ED Provider Notes (Addendum)
CSN: [id number]     Arrival date & time 11/01/14  1648 History   First MD Initiated Contact with Patient 11/01/14 1724     Chief Complaint  Patient presents with  . Chest Pain      HPI  Patient presents for evaluation of mid sternal pain. States it is sharp and painful to breathe and has been since awakening yesterday morning. Hurts to roll over touch. It hurts to take deep breaths or cough. Presents for evaluation. Is not short of breath. No cough. No fevers or chills. No GI complaints. No trauma to the area.  Past Medical History  Diagnosis Date  . Meniscus tear   . Migraines    Past Surgical History  Procedure Laterality Date  . Fracture surgery Right     thumb   Family History  Problem Relation Age of Onset  . Diabetes Other   . Hypertension Other    History  Substance Use Topics  . Smoking status: Never Smoker   . Smokeless tobacco: Never Used  . Alcohol Use: No    Review of Systems  Constitutional: Negative for fever, chills, diaphoresis, appetite change and fatigue.  HENT: Negative for mouth sores, sore throat and trouble swallowing.   Eyes: Negative for visual disturbance.  Respiratory: Negative for cough, chest tightness, shortness of breath and wheezing.   Cardiovascular: Negative for chest pain.  Gastrointestinal: Negative for nausea, vomiting, abdominal pain, diarrhea and abdominal distention.  Endocrine: Negative for polydipsia, polyphagia and polyuria.  Genitourinary: Negative for dysuria, frequency and hematuria.  Musculoskeletal: Negative for gait problem.  Skin: Negative for color change, pallor and rash.  Neurological: Negative for dizziness, syncope, light-headedness and headaches.  Hematological: Does not bruise/bleed easily.  Psychiatric/Behavioral: Negative for behavioral problems and confusion.      Allergies  Review of patient's allergies indicates no known allergies.  Home Medications   Prior to Admission medications   Medication Sig  Start Date End Date Taking? Authorizing Provider  sodium chloride (OCEAN) 0.65 % SOLN nasal spray Place 2 sprays into both nostrils as needed for congestion. 08/02/14  Yes Noland Fordyce, PA-C  Alum & Mag Hydroxide-Simeth (MAGIC MOUTHWASH W/LIDOCAINE) SOLN Take 5 mLs by mouth 4 (four) times daily as needed for mouth pain. Patient not taking: Reported on 11/01/2014 09/30/14   Larene Pickett, PA-C  amoxicillin (AMOXIL) 500 MG capsule Take 1 capsule (500 mg total) by mouth 3 (three) times daily. Patient not taking: Reported on 11/01/2014 06/16/14   Lajean Saver, MD  HYDROcodone-acetaminophen (NORCO/VICODIN) 5-325 MG per tablet Take 1 tablet by mouth every 6 (six) hours as needed for moderate pain. Patient not taking: Reported on 06/18/2014 04/08/14   Evelina Bucy, MD  ibuprofen (ADVIL,MOTRIN) 800 MG tablet Take 1 tablet (800 mg total) by mouth 3 (three) times daily. 11/01/14   Tanna Furry, MD  naproxen (NAPROSYN) 500 MG tablet Take 1 tablet (500 mg total) by mouth 2 (two) times daily with a meal. Patient not taking: Reported on 11/01/2014 06/18/14   Ernestina Patches, MD  phenol (CHLORASEPTIC) 1.4 % LIQD Use as directed 1 spray in the mouth or throat as needed for throat irritation / pain. Patient not taking: Reported on 11/01/2014 08/02/14   Noland Fordyce, PA-C   BP 123/64 mmHg  Pulse 78  Temp(Src) 98.7 F (37.1 C) (Oral)  Resp 18  SpO2 100% Physical Exam  Constitutional: He is oriented to person, place, and time. He appears well-developed and well-nourished. No distress.  HENT:  Head: Normocephalic.  Eyes: Conjunctivae are normal. Pupils are equal, round, and reactive to light. No scleral icterus.  Neck: Normal range of motion. Neck supple. No thyromegaly present.  Cardiovascular: Normal rate and regular rhythm.  Exam reveals no gallop and no friction rub.   No murmur heard. Pulmonary/Chest: Effort normal and breath sounds normal. No respiratory distress. He has no wheezes. He has no rales.  Abdominal:  Soft. Bowel sounds are normal. He exhibits no distension. There is no tenderness. There is no rebound.  Musculoskeletal: Normal range of motion.  Neurological: He is alert and oriented to person, place, and time.  Skin: Skin is warm and dry. No rash noted.  Psychiatric: He has a normal mood and affect. His behavior is normal.    ED Course  Procedures (including critical care time) Labs Review Labs Reviewed  BASIC METABOLIC PANEL - Abnormal; Notable for the following:    Glucose, Bld 112 (*)    All other components within normal limits  CBC  I-STAT TROPOININ, ED    Imaging Review No results found.   EKG Interpretation   Date/Time:  Monday Nov 01 2014 16:53:58 EDT Ventricular Rate:  72 PR Interval:  148 QRS Duration: 75 QT Interval:  363 QTC Calculation: 397 R Axis:   70 Text Interpretation:  Sinus arrhythmia ED PHYSICIAN INTERPRETATION  AVAILABLE IN [place] Confirmed by TEST, Record ([id number]) on  11/03/2014 7:02:27 AM      MDM   Final diagnoses:  Chest wall pain    Reassuring testing here. Low heart score. . Plan is home with treatment as outlined above.    Tanna Furry, MD 11/03/14 4497  Tanna Furry, MD 11/17/14 [phone]

## 2014-11-06 ENCOUNTER — Encounter (HOSPITAL_COMMUNITY): Payer: Self-pay | Admitting: Emergency Medicine

## 2014-11-06 ENCOUNTER — Emergency Department (HOSPITAL_COMMUNITY)
Admission: EM | Admit: 2014-11-06 | Discharge: 2014-11-07 | Disposition: A | Discharge status: Home / Self Care | Attending: Emergency Medicine | Admitting: Emergency Medicine

## 2014-11-06 DIAGNOSIS — J029 Acute pharyngitis, unspecified: Secondary | ICD-10-CM | POA: Insufficient documentation

## 2014-11-06 DIAGNOSIS — Z8659 Personal history of other mental and behavioral disorders: Secondary | ICD-10-CM | POA: Insufficient documentation

## 2014-11-06 DIAGNOSIS — R21 Rash and other nonspecific skin eruption: Secondary | ICD-10-CM | POA: Diagnosis not present

## 2014-11-06 DIAGNOSIS — Z87828 Personal history of other (healed) physical injury and trauma: Secondary | ICD-10-CM | POA: Diagnosis not present

## 2014-11-06 DIAGNOSIS — B09 Unspecified viral infection characterized by skin and mucous membrane lesions: Secondary | ICD-10-CM

## 2014-11-06 DIAGNOSIS — Z79899 Other long term (current) drug therapy: Secondary | ICD-10-CM | POA: Insufficient documentation

## 2014-11-06 DIAGNOSIS — Z720 Tobacco use: Secondary | ICD-10-CM | POA: Insufficient documentation

## 2014-11-06 NOTE — ED Notes (Signed)
Pt c/o sore throat, HA, rash to hands and feet onset yesterday.

## 2014-11-06 NOTE — ED Provider Notes (Addendum)
CSN: [id number]     Arrival date & time 11/06/14  2254 History  This chart was scribed for Junius Creamer, NP, working with Shanon Rosser, MD by Girtha Hake, ED Scribe. The patient was seen in [facility]. The patient's care was started at 11:24 PM.     Chief Complaint  Patient presents with  . Sore Throat   HPI HPI Comments: Bradley Larson is a 19 y.o. male who presents to the Emergency Department complaining of a sore throat and myalgias beginning last night. Patient reports difficulty swallowing due to the pain. He believes he had a fever but did not measure his temperature. Patient also complains of a rash on his hands and feet bilaterally. He took ibuprofen yesterday but has not taken any medications today.    Past Medical History  Diagnosis Date  . Meniscus tear   . Migraines    Past Surgical History  Procedure Laterality Date  . Fracture surgery Right     thumb   Family History  Problem Relation Age of Onset  . Diabetes Other   . Hypertension Other    History  Substance Use Topics  . Smoking status: Current Some Day Smoker  . Smokeless tobacco: Never Used  . Alcohol Use: Yes    Review of Systems    Allergies  Review of patient's allergies indicates no known allergies.  Home Medications   Prior to Admission medications   Medication Sig Start Date End Date Taking? Authorizing Provider  Alum & Mag Hydroxide-Simeth (MAGIC MOUTHWASH W/LIDOCAINE) SOLN Take 5 mLs by mouth 4 (four) times daily as needed for mouth pain. Patient not taking: Reported on 11/01/2014 09/30/14   Larene Pickett, PA-C  amoxicillin (AMOXIL) 500 MG capsule Take 1 capsule (500 mg total) by mouth 3 (three) times daily. Patient not taking: Reported on 11/01/2014 06/16/14   Lajean Saver, MD  HYDROcodone-acetaminophen (NORCO/VICODIN) 5-325 MG per tablet Take 1 tablet by mouth every 6 (six) hours as needed for moderate pain. Patient not taking: Reported on 06/18/2014 04/08/14   Evelina Bucy, MD  ibuprofen  (ADVIL,MOTRIN) 800 MG tablet Take 1 tablet (800 mg total) by mouth 3 (three) times daily. 11/01/14   Tanna Furry, MD  naproxen (NAPROSYN) 500 MG tablet Take 1 tablet (500 mg total) by mouth 2 (two) times daily with a meal. Patient not taking: Reported on 11/01/2014 06/18/14   Ernestina Patches, MD  phenol (CHLORASEPTIC) 1.4 % LIQD Use as directed 1 spray in the mouth or throat as needed for throat irritation / pain. Patient not taking: Reported on 11/01/2014 08/02/14   Noland Fordyce, PA-C  sodium chloride (OCEAN) 0.65 % SOLN nasal spray Place 2 sprays into both nostrils as needed for congestion. 08/02/14   Noland Fordyce, PA-C   Triage Vitals: BP 142/77 mmHg  Pulse 83  Temp(Src) 98.7 F (37.1 C) (Oral)  Resp 20  SpO2 99% Physical Exam  Constitutional: He is oriented to person, place, and time. He appears well-developed and well-nourished.  Eyes: Pupils are equal, round, and reactive to light.  Neck: Normal range of motion.  Cardiovascular: Normal rate.   Pulmonary/Chest: Effort normal and breath sounds normal. He exhibits tenderness.  Abdominal: Soft.  Musculoskeletal: Normal range of motion.  Neurological: He is alert and oriented to person, place, and time.  Skin: Skin is warm.  Nursing note and vitals reviewed.   ED Course  Procedures (including critical care time) DIAGNOSTIC STUDIES: Oxygen Saturation is 99% on room air, normal by my interpretation.  COORDINATION OF CARE:    Labs Review Labs Reviewed  RAPID STREP SCREEN (NOT AT [facility])  CULTURE, GROUP A STREP  RPR  HIV ANTIBODY (ROUTINE TESTING)    Imaging Review No results found.   EKG Interpretation None     Patient DC despite non resulting HIV and RPR test results MDM   Final diagnoses:  Pharyngitis  Viral rash   I personally performed the services described in this documentation, which was scribed in my presence. The recorded information has been reviewed and is accurate.  Junius Creamer, NP 11/07/14 [place], NP 11/07/14 1859  Dorie Rank, MD 11/10/14 1443  Junius Creamer, NP 11/18/14 1950  Dorie Rank, MD 11/18/14 [phone]

## 2014-11-06 NOTE — ED Notes (Signed)
Pt reports pain in throat and difficulty swallowing, states it burned when eating pinapple today. He has a rash to palms of hands and between toes on feet.

## 2014-11-07 LAB — RPR: RPR Ser Ql: NONREACTIVE

## 2014-11-07 LAB — HIV ANTIBODY (ROUTINE TESTING W REFLEX): HIV Screen 4th Generation wRfx: NONREACTIVE

## 2014-11-07 LAB — RAPID STREP SCREEN (MED CTR MEBANE ONLY): STREPTOCOCCUS, GROUP A SCREEN (DIRECT): NEGATIVE

## 2014-11-07 NOTE — Discharge Instructions (Signed)
It is imperative that you follow-up with your primary care physician as there is some blood work that is still outstanding specifically HIV and syphilis testing which would explain the rash that you have on the palms of your hand or myalgias and sore throat the results will be resulted within a week and he will be notified personally by phone

## 2014-11-07 NOTE — ED Notes (Signed)
Pt advised not to have sex until he finds out the results to his HIV and RPR test.  Pt notified that they would call in a weeks time to let him know if the results are positive.  Family at bedside.  MD [place] did eval pt.

## 2014-11-09 LAB — CULTURE, GROUP A STREP: STREP A CULTURE: NEGATIVE

## 2015-07-01 ENCOUNTER — Encounter (HOSPITAL_COMMUNITY): Payer: Self-pay | Admitting: Emergency Medicine

## 2015-07-01 ENCOUNTER — Emergency Department (HOSPITAL_COMMUNITY)
Admission: EM | Admit: 2015-07-01 | Discharge: 2015-07-01 | Disposition: A | Discharge status: Home / Self Care | Attending: Emergency Medicine | Admitting: Emergency Medicine

## 2015-07-01 DIAGNOSIS — K640 First degree hemorrhoids: Secondary | ICD-10-CM | POA: Diagnosis not present

## 2015-07-01 DIAGNOSIS — F17200 Nicotine dependence, unspecified, uncomplicated: Secondary | ICD-10-CM | POA: Insufficient documentation

## 2015-07-01 DIAGNOSIS — K5900 Constipation, unspecified: Secondary | ICD-10-CM | POA: Diagnosis not present

## 2015-07-01 DIAGNOSIS — L290 Pruritus ani: Secondary | ICD-10-CM | POA: Diagnosis not present

## 2015-07-01 DIAGNOSIS — Z8679 Personal history of other diseases of the circulatory system: Secondary | ICD-10-CM | POA: Insufficient documentation

## 2015-07-01 DIAGNOSIS — Z87828 Personal history of other (healed) physical injury and trauma: Secondary | ICD-10-CM | POA: Diagnosis not present

## 2015-07-01 DIAGNOSIS — K625 Hemorrhage of anus and rectum: Secondary | ICD-10-CM | POA: Diagnosis present

## 2015-07-01 MED ORDER — POLYETHYLENE GLYCOL 3350 17 GM/SCOOP PO POWD: 17.0000 g | Freq: Two times a day (BID) | ORAL | Status: DC

## 2015-07-01 NOTE — ED Notes (Signed)
Pt states that x several days he has had itching and and uncomfortable feeling around his anus. States he has had a small amount of blood. Denies rectal insertion of any objects. Alert and oriented.

## 2015-07-01 NOTE — ED Provider Notes (Signed)
CSN: [id number]     Arrival date & time 07/01/15  0008 History  By signing my name below, I, Meriel Pica, attest that this documentation has been prepared under the direction and in the presence of Montine Circle, PA-C. Electronically Signed: Meriel Pica, ED Scribe. 07/01/2015. 12:38 AM.   Chief Complaint  Patient presents with  . Anal Itching  . Rectal Bleeding   The history is provided by the patient. No language interpreter was used.   HPI Comments: MASSIMO SHIPES is a 20 y.o. male who presents to the Emergency Department complaining of sudden onset, intermittent bright red blood noticed on toilet paper with wiping onset 7 days ago that has progressed to rectal itching and pain to rectum. The pt admits to straining with BMs and constipation. He notes he took a laxative last week without significant relief of constipation. He denies a h/o of like symptoms or hemorrhoids.   Past Medical History  Diagnosis Date  . Meniscus tear   . Migraines    Past Surgical History  Procedure Laterality Date  . Fracture surgery Right     thumb   Family History  Problem Relation Age of Onset  . Diabetes Other   . Hypertension Other    Social History  Substance Use Topics  . Smoking status: Current Some Day Smoker  . Smokeless tobacco: Never Used  . Alcohol Use: Yes    Review of Systems  Constitutional: Negative for fever.  Gastrointestinal: Positive for constipation and anal bleeding.   Allergies  Review of patient's allergies indicates no known allergies.  Home Medications   Prior to Admission medications   Medication Sig Start Date End Date Taking? Authorizing Provider  Alum & Mag Hydroxide-Simeth (MAGIC MOUTHWASH W/LIDOCAINE) SOLN Take 5 mLs by mouth 4 (four) times daily as needed for mouth pain. Patient not taking: Reported on 11/01/2014 09/30/14   Larene Pickett, PA-C  amoxicillin (AMOXIL) 500 MG capsule Take 1 capsule (500 mg total) by mouth 3 (three) times  daily. Patient not taking: Reported on 11/01/2014 06/16/14   Lajean Saver, MD  HYDROcodone-acetaminophen (NORCO/VICODIN) 5-325 MG per tablet Take 1 tablet by mouth every 6 (six) hours as needed for moderate pain. Patient not taking: Reported on 06/18/2014 04/08/14   Evelina Bucy, MD  ibuprofen (ADVIL,MOTRIN) 800 MG tablet Take 1 tablet (800 mg total) by mouth 3 (three) times daily. 11/01/14   Tanna Furry, MD  naproxen (NAPROSYN) 500 MG tablet Take 1 tablet (500 mg total) by mouth 2 (two) times daily with a meal. Patient not taking: Reported on 11/01/2014 06/18/14   Ernestina Patches, MD  phenol (CHLORASEPTIC) 1.4 % LIQD Use as directed 1 spray in the mouth or throat as needed for throat irritation / pain. Patient not taking: Reported on 11/01/2014 08/02/14   Noland Fordyce, PA-C  sodium chloride (OCEAN) 0.65 % SOLN nasal spray Place 2 sprays into both nostrils as needed for congestion. 08/02/14   Noland Fordyce, PA-C   BP 144/89 mmHg  Pulse 90  Temp(Src) 97.8 F (36.6 C) (Oral)  Resp 18  SpO2 98% Physical Exam  Constitutional: He is oriented to person, place, and time. He appears well-developed and well-nourished.  HENT:  Head: Normocephalic and atraumatic.  Eyes: EOM are normal. Pupils are equal, round, and reactive to light.  Neck: Neck supple.  Cardiovascular: Normal rate, regular rhythm and normal heart sounds.   Pulmonary/Chest: Effort normal and breath sounds normal. No respiratory distress. He has no wheezes. He has no rales.  He exhibits no tenderness.  Abdominal: Soft. Bowel sounds are normal. He exhibits no distension and no mass. There is no tenderness. There is no rebound and no guarding.  Genitourinary:  Chaperone present for exam; mild first degree external hemorrhoid.   Musculoskeletal: Normal range of motion. He exhibits no edema.  Neurological: He is alert and oriented to person, place, and time. No cranial nerve deficit.  Skin: Skin is warm and dry.  Psychiatric: He has a normal mood  and affect.  Nursing note and vitals reviewed.   ED Course  Procedures  DIAGNOSTIC STUDIES: Oxygen Saturation is 98% on RA, normal by my interpretation.    COORDINATION OF CARE: 12:30 AM Discussed treatment plan with pt. Will prescribe a stool softener and advised pt to get Preparation H OTC. Pt acknowledges and agrees to plan.   MDM   Final diagnoses:  First degree hemorrhoids    Patient with anal itching, some BRBPR on toilet pain.  Small hemorrhoid palpated.  DC with miralax and recommend preparation H.  I personally performed the services described in this documentation, which was scribed in my presence. The recorded information has been reviewed and is accurate.      Montine Circle, PA-C 07/01/15 [place], DO 07/03/15 [id number]

## 2015-07-01 NOTE — Discharge Instructions (Signed)
Get Preparation H at the store.  Use daily after bowel movements for 1 week.   Hemorrhoids Hemorrhoids are swollen veins around the rectum or anus. There are two types of hemorrhoids:   Internal hemorrhoids. These occur in the veins just inside the rectum. They may poke through to the outside and become irritated and painful.  External hemorrhoids. These occur in the veins outside the anus and can be felt as a painful swelling or hard lump near the anus. CAUSES  Pregnancy.   Obesity.   Constipation or diarrhea.   Straining to have a bowel movement.   Sitting for long periods on the toilet.  Heavy lifting or other activity that caused you to strain.  Anal intercourse. SYMPTOMS   Pain.   Anal itching or irritation.   Rectal bleeding.   Fecal leakage.   Anal swelling.   One or more lumps around the anus.  DIAGNOSIS  Your caregiver may be able to diagnose hemorrhoids by visual examination. Other examinations or tests that may be performed include:   Examination of the rectal area with a gloved hand (digital rectal exam).   Examination of anal canal using a small tube (scope).   A blood test if you have lost a significant amount of blood.  A test to look inside the colon (sigmoidoscopy or colonoscopy). TREATMENT Most hemorrhoids can be treated at home. However, if symptoms do not seem to be getting better or if you have a lot of rectal bleeding, your caregiver may perform a procedure to help make the hemorrhoids get smaller or remove them completely. Possible treatments include:   Placing a rubber band at the base of the hemorrhoid to cut off the circulation (rubber band ligation).   Injecting a chemical to shrink the hemorrhoid (sclerotherapy).   Using a tool to burn the hemorrhoid (infrared light therapy).   Surgically removing the hemorrhoid (hemorrhoidectomy).   Stapling the hemorrhoid to block blood flow to the tissue (hemorrhoid stapling).   HOME CARE INSTRUCTIONS   Eat foods with fiber, such as whole grains, beans, nuts, fruits, and vegetables. Ask your doctor about taking products with added fiber in them (fibersupplements).  Increase fluid intake. Drink enough water and fluids to keep your urine clear or pale yellow.   Exercise regularly.   Go to the bathroom when you have the urge to have a bowel movement. Do not wait.   Avoid straining to have bowel movements.   Keep the anal area dry and clean. Use wet toilet paper or moist towelettes after a bowel movement.   Medicated creams and suppositories may be used or applied as directed.   Only take over-the-counter or prescription medicines as directed by your caregiver.   Take warm sitz baths for 15-20 minutes, 3-4 times a day to ease pain and discomfort.   Place ice packs on the hemorrhoids if they are tender and swollen. Using ice packs between sitz baths may be helpful.   Put ice in a plastic bag.   Place a towel between your skin and the bag.   Leave the ice on for 15-20 minutes, 3-4 times a day.   Do not use a donut-shaped pillow or sit on the toilet for long periods. This increases blood pooling and pain.  SEEK MEDICAL CARE IF:  You have increasing pain and swelling that is not controlled by treatment or medicine.  You have uncontrolled bleeding.  You have difficulty or you are unable to have a bowel movement.  You  have pain or inflammation outside the area of the hemorrhoids. MAKE SURE YOU:  Understand these instructions.  Will watch your condition.  Will get help right away if you are not doing well or get worse.   This information is not intended to replace advice given to you by your health care provider. Make sure you discuss any questions you have with your health care provider.   Document Released: 05/25/2000 Document Revised: 05/14/2012 Document Reviewed: 04/01/2012 Elsevier Interactive Patient Education [employer].

## 2015-07-22 ENCOUNTER — Emergency Department (HOSPITAL_COMMUNITY)
Admission: EM | Admit: 2015-07-22 | Discharge: 2015-07-23 | Disposition: A | Discharge status: Home / Self Care | Attending: Emergency Medicine | Admitting: Emergency Medicine

## 2015-07-22 DIAGNOSIS — Z87828 Personal history of other (healed) physical injury and trauma: Secondary | ICD-10-CM | POA: Insufficient documentation

## 2015-07-22 DIAGNOSIS — Z791 Long term (current) use of non-steroidal anti-inflammatories (NSAID): Secondary | ICD-10-CM | POA: Diagnosis not present

## 2015-07-22 DIAGNOSIS — R000 Tachycardia, unspecified: Secondary | ICD-10-CM | POA: Diagnosis not present

## 2015-07-22 DIAGNOSIS — F17200 Nicotine dependence, unspecified, uncomplicated: Secondary | ICD-10-CM | POA: Insufficient documentation

## 2015-07-22 DIAGNOSIS — B349 Viral infection, unspecified: Secondary | ICD-10-CM | POA: Insufficient documentation

## 2015-07-22 DIAGNOSIS — R197 Diarrhea, unspecified: Secondary | ICD-10-CM

## 2015-07-22 DIAGNOSIS — Z8679 Personal history of other diseases of the circulatory system: Secondary | ICD-10-CM | POA: Insufficient documentation

## 2015-07-23 ENCOUNTER — Encounter (HOSPITAL_COMMUNITY): Payer: Self-pay | Admitting: *Deleted

## 2015-07-23 MED ORDER — IBUPROFEN 200 MG PO TABS
600.0000 mg | ORAL_TABLET | Freq: Once | ORAL | Status: AC
  Administered 2015-07-23: 600 mg via ORAL
  Filled 2015-07-23: qty 3

## 2015-07-23 NOTE — ED Notes (Signed)
Pt states his girlfriend had same symptoms yesterday but feels better today,  He now has nausea, chills,  Headache and diarrhea,  Pt is alert and oriented in NAD

## 2015-07-23 NOTE — ED Notes (Signed)
Pt states "I took ibuprofen yesterday morning."

## 2015-07-23 NOTE — ED Provider Notes (Signed)
CSN: [id number]     Arrival date & time 07/22/15  2352 History   First MD Initiated Contact with Patient 07/23/15 0020     Chief Complaint  Patient presents with  . Headache  . Diarrhea  . Chills  . Nausea     (Consider location/radiation/quality/duration/timing/severity/associated sxs/prior Treatment) HPI Comments: Is a 20 year old male with a history of 3 loose bowel movements today some myalgias, headache, he states his girlfriend had same symptoms yesterday but feels better today.  She had associated nausea and vomiting as well.  Patient states he woke up feeling unwell this morning.  He took ibuprofen, which did help but has not taken any since.  Denies any rhinitis, cough, nausea, vomiting  Patient is a 20 y.o. male presenting with headaches and diarrhea. The history is provided by the patient.  Headache Pain location:  Generalized Severity currently:  2/10 Onset quality:  Gradual Timing:  Intermittent Progression:  Resolved Chronicity:  New Relieved by:  NSAIDs Associated symptoms: diarrhea   Associated symptoms: no abdominal pain, no cough, no dizziness, no fever and no sore throat   Diarrhea:    Quality:  Semi-solid   Number of occurrences:  3   Severity:  Mild   Timing:  Intermittent   Progression:  Improving Diarrhea Associated symptoms: chills and headaches   Associated symptoms: no abdominal pain and no fever     Past Medical History  Diagnosis Date  . Meniscus tear   . Migraines    Past Surgical History  Procedure Laterality Date  . Fracture surgery Right     thumb   Family History  Problem Relation Age of Onset  . Diabetes Other   . Hypertension Other    Social History  Substance Use Topics  . Smoking status: Current Some Day Smoker  . Smokeless tobacco: Never Used  . Alcohol Use: Yes    Review of Systems  Constitutional: Positive for chills. Negative for fever.  HENT: Negative for rhinorrhea and sore throat.   Respiratory: Negative for  cough and shortness of breath.   Gastrointestinal: Positive for diarrhea. Negative for abdominal pain.  Neurological: Positive for headaches. Negative for dizziness.  All other systems reviewed and are negative.     Allergies  Review of patient's allergies indicates no known allergies.  Home Medications   Prior to Admission medications   Medication Sig Start Date End Date Taking? Authorizing Provider  Alum & Mag Hydroxide-Simeth (MAGIC MOUTHWASH W/LIDOCAINE) SOLN Take 5 mLs by mouth 4 (four) times daily as needed for mouth pain. Patient not taking: Reported on 11/01/2014 09/30/14   Larene Pickett, PA-C  amoxicillin (AMOXIL) 500 MG capsule Take 1 capsule (500 mg total) by mouth 3 (three) times daily. Patient not taking: Reported on 11/01/2014 06/16/14   Lajean Saver, MD  HYDROcodone-acetaminophen (NORCO/VICODIN) 5-325 MG per tablet Take 1 tablet by mouth every 6 (six) hours as needed for moderate pain. Patient not taking: Reported on 06/18/2014 04/08/14   Evelina Bucy, MD  ibuprofen (ADVIL,MOTRIN) 800 MG tablet Take 1 tablet (800 mg total) by mouth 3 (three) times daily. 11/01/14   Tanna Furry, MD  naproxen (NAPROSYN) 500 MG tablet Take 1 tablet (500 mg total) by mouth 2 (two) times daily with a meal. Patient not taking: Reported on 11/01/2014 06/18/14   Ernestina Patches, MD  phenol (CHLORASEPTIC) 1.4 % LIQD Use as directed 1 spray in the mouth or throat as needed for throat irritation / pain. Patient not taking: Reported on 11/01/2014 08/02/14  Noland Fordyce, PA-C  polyethylene glycol powder (GLYCOLAX/MIRALAX) powder Take 17 g by mouth 2 (two) times daily. Until daily soft stools  OTC 07/01/15   Montine Circle, PA-C  sodium chloride (OCEAN) 0.65 % SOLN nasal spray Place 2 sprays into both nostrils as needed for congestion. 08/02/14   Noland Fordyce, PA-C   BP 101/88 mmHg  Pulse 117  Temp(Src) 99.8 F (37.7 C) (Oral)  Resp 16  Ht 6\' 3"  (1.905 m)  Wt 131.543 kg  BMI 36.25 kg/m2  SpO2  98% Physical Exam  Constitutional: He is oriented to person, place, and time. He appears well-developed and well-nourished.  HENT:  Head: Normocephalic.  Eyes: Pupils are equal, round, and reactive to light.  Neck: Normal range of motion.  Cardiovascular: Tachycardia present.   Pulmonary/Chest: Effort normal and breath sounds normal.  Abdominal: Soft. He exhibits no distension. There is no tenderness.  Musculoskeletal: Normal range of motion.  Neurological: He is alert and oriented to person, place, and time.  Skin: Skin is warm and dry. No rash noted.  Nursing note and vitals reviewed.   ED Course  Procedures (including critical care time) Labs Review Labs Reviewed - No data to display  Imaging Review No results found. I have personally reviewed and evaluated these images and lab results as part of my medical decision-making.   EKG Interpretation None     Patient does not appear to be any distress.  Noted that he is slightly tachycardic at 117, and borderline elevation of temperature of 98.8.  He will be given sedation milligrams ibuprofen by mouth fluids and reevaluated MDM   Final diagnoses:  Viral syndrome  Diarrhea, unspecified type         Junius Creamer, NP 07/23/15 1956  Malvin Johns, MD 07/26/15 [phone]

## 2015-07-23 NOTE — Discharge Instructions (Signed)
You have been give a diet to follow for the next several days

## 2015-09-05 ENCOUNTER — Emergency Department (HOSPITAL_COMMUNITY)
Admission: EM | Admit: 2015-09-05 | Discharge: 2015-09-05 | Disposition: A | Discharge status: Home / Self Care | Attending: Emergency Medicine | Admitting: Emergency Medicine

## 2015-09-05 ENCOUNTER — Emergency Department (HOSPITAL_COMMUNITY)

## 2015-09-05 ENCOUNTER — Encounter (HOSPITAL_COMMUNITY): Payer: Self-pay | Admitting: Emergency Medicine

## 2015-09-05 DIAGNOSIS — Z8679 Personal history of other diseases of the circulatory system: Secondary | ICD-10-CM | POA: Diagnosis not present

## 2015-09-05 DIAGNOSIS — Z79899 Other long term (current) drug therapy: Secondary | ICD-10-CM | POA: Insufficient documentation

## 2015-09-05 DIAGNOSIS — F17200 Nicotine dependence, unspecified, uncomplicated: Secondary | ICD-10-CM | POA: Insufficient documentation

## 2015-09-05 DIAGNOSIS — Y9344 Activity, trampolining: Secondary | ICD-10-CM | POA: Insufficient documentation

## 2015-09-05 DIAGNOSIS — Y998 Other external cause status: Secondary | ICD-10-CM | POA: Diagnosis not present

## 2015-09-05 DIAGNOSIS — Y9289 Other specified places as the place of occurrence of the external cause: Secondary | ICD-10-CM | POA: Diagnosis not present

## 2015-09-05 DIAGNOSIS — S93401A Sprain of unspecified ligament of right ankle, initial encounter: Secondary | ICD-10-CM | POA: Diagnosis not present

## 2015-09-05 DIAGNOSIS — X58XXXA Exposure to other specified factors, initial encounter: Secondary | ICD-10-CM | POA: Diagnosis not present

## 2015-09-05 DIAGNOSIS — S99911A Unspecified injury of right ankle, initial encounter: Secondary | ICD-10-CM | POA: Diagnosis present

## 2015-09-05 MED ORDER — IBUPROFEN 200 MG PO TABS: 600.0000 mg | ORAL_TABLET | Freq: Once | ORAL | Status: DC

## 2015-09-05 MED ORDER — IBUPROFEN 600 MG PO TABS: 600.0000 mg | ORAL_TABLET | Freq: Four times a day (QID) | ORAL | Status: DC | PRN

## 2015-09-05 NOTE — ED Notes (Signed)
Pt twisted right ankle [place] days ago [place]ampoline park.

## 2015-09-05 NOTE — Discharge Instructions (Signed)
Acute Ankle Sprain With Phase I Rehab An acute ankle sprain is a partial or complete tear in one or more of the ligaments of the ankle due to traumatic injury. The severity of the injury depends on both the number of ligaments sprained and the grade of sprain. There are 3 grades of sprains.   A grade 1 sprain is a mild sprain. There is a slight pull without obvious tearing. There is no loss of strength, and the muscle and ligament are the correct length.  A grade 2 sprain is a moderate sprain. There is tearing of fibers within the substance of the ligament where it connects two bones or two cartilages. The length of the ligament is increased, and there is usually decreased strength.  A grade 3 sprain is a complete rupture of the ligament and is uncommon. In addition to the grade of sprain, there are three types of ankle sprains.  Lateral ankle sprains: This is a sprain of one or more of the three ligaments on the outer side (lateral) of the ankle. These are the most common sprains. Medial ankle sprains: There is one large triangular ligament of the inner side (medial) of the ankle that is susceptible to injury. Medial ankle sprains are less common. Syndesmosis, "high ankle," sprains: The syndesmosis is the ligament that connects the two bones of the lower leg. Syndesmosis sprains usually only occur with very severe ankle sprains. SYMPTOMS  Pain, tenderness, and swelling in the ankle, starting at the side of injury that may progress to the whole ankle and foot with time.  "Pop" or tearing sensation at the time of injury.  Bruising that may spread to the heel.  Impaired ability to walk soon after injury. CAUSES   Acute ankle sprains are caused by trauma placed on the ankle that temporarily forces or pries the anklebone (talus) out of its normal socket.  Stretching or tearing of the ligaments that normally hold the joint in place (usually due to a twisting injury). RISK INCREASES  WITH:  Previous ankle sprain.  Sports in which the foot may land awkwardly (i.e., basketball, volleyball, or soccer) or walking or running on uneven or rough surfaces.  Shoes with inadequate support to prevent sideways motion when stress occurs.  Poor strength and flexibility.  Poor balance skills.  Contact sports. PREVENTION   Warm up and stretch properly before activity.  Maintain physical fitness:  Ankle and leg flexibility, muscle strength, and endurance.  Cardiovascular fitness.  Balance training activities.  Use proper technique and have a coach correct improper technique.  Taping, protective strapping, bracing, or high-top tennis shoes may help prevent injury. Initially, tape is best; however, it loses most of its support function within 10 to 15 minutes.  Wear proper-fitted protective shoes (High-top shoes with taping or bracing is more effective than either alone).  Provide the ankle with support during sports and practice activities for 12 months following injury. PROGNOSIS   If treated properly, ankle sprains can be expected to recover completely; however, the length of recovery depends on the degree of injury.  A grade 1 sprain usually heals enough in 5 to 7 days to allow modified activity and requires an average of 6 weeks to heal completely.  A grade 2 sprain requires 6 to 10 weeks to heal completely.  A grade 3 sprain requires 12 to 16 weeks to heal.  A syndesmosis sprain often takes more than 3 months to heal. RELATED COMPLICATIONS   Frequent recurrence of symptoms may   result in a chronic problem. Appropriately addressing the problem the first time decreases the frequency of recurrence and optimizes healing time. Severity of the initial sprain does not predict the likelihood of later instability.  Injury to other structures (bone, cartilage, or tendon).  A chronically unstable or arthritic ankle joint is a possibility with repeated  sprains. TREATMENT Treatment initially involves the use of ice, medication, and compression bandages to help reduce pain and inflammation. Ankle sprains are usually immobilized in a walking cast or boot to allow for healing. Crutches may be recommended to reduce pressure on the injury. After immobilization, strengthening and stretching exercises may be necessary to regain strength and a full range of motion. Surgery is rarely needed to treat ankle sprains. MEDICATION   Nonsteroidal anti-inflammatory medications, such as aspirin and ibuprofen (do not take for the first 3 days after injury or within 7 days before surgery), or other minor pain relievers, such as acetaminophen, are often recommended. Take these as directed by your caregiver. Contact your caregiver immediately if any bleeding, stomach upset, or signs of an allergic reaction occur from these medications.  Ointments applied to the skin may be helpful.  Pain relievers may be prescribed as necessary by your caregiver. Do not take prescription pain medication for longer than 4 to 7 days. Use only as directed and only as much as you need. HEAT AND COLD  Cold treatment (icing) is used to relieve pain and reduce inflammation for acute and chronic cases. Cold should be applied for 10 to 15 minutes every 2 to 3 hours for inflammation and pain and immediately after any activity that aggravates your symptoms. Use ice packs or an ice massage.  Heat treatment may be used before performing stretching and strengthening activities prescribed by your caregiver. Use a heat pack or a warm soak. SEEK IMMEDIATE MEDICAL CARE IF:   Pain, swelling, or bruising worsens despite treatment.  You experience pain, numbness, discoloration, or coldness in the foot or toes.  New, unexplained symptoms develop (drugs used in treatment may produce side effects.) EXERCISES  PHASE I EXERCISES RANGE OF MOTION (ROM) AND STRETCHING EXERCISES - Ankle Sprain, Acute Phase I,  Weeks 1 to 2 These exercises may help you when beginning to restore flexibility in your ankle. You will likely work on these exercises for the 1 to 2 weeks after your injury. Once your physician, physical therapist, or athletic trainer sees adequate progress, he or she will advance your exercises. While completing these exercises, remember:   Restoring tissue flexibility helps normal motion to return to the joints. This allows healthier, less painful movement and activity.  An effective stretch should be held for at least 30 seconds.  A stretch should never be painful. You should only feel a gentle lengthening or release in the stretched tissue. RANGE OF MOTION - Dorsi/Plantar Flexion  While sitting with your right / left knee straight, draw the top of your foot upwards by flexing your ankle. Then reverse the motion, pointing your toes downward.  Hold each position for __________ seconds.  After completing your first set of exercises, repeat this exercise with your knee bent. Repeat __________ times. Complete this exercise __________ times per day.  RANGE OF MOTION - Ankle Alphabet  Imagine your right / left big toe is a pen.  Keeping your hip and knee still, write out the entire alphabet with your "pen." Make the letters as large as you can without increasing any discomfort. Repeat __________ times. Complete this exercise __________  times per day.  STRENGTHENING EXERCISES - Ankle Sprain, Acute -Phase I, Weeks 1 to 2 These exercises may help you when beginning to restore strength in your ankle. You will likely work on these exercises for 1 to 2 weeks after your injury. Once your physician, physical therapist, or athletic trainer sees adequate progress, he or she will advance your exercises. While completing these exercises, remember:   Muscles can gain both the endurance and the strength needed for everyday activities through controlled exercises.  Complete these exercises as instructed by  your physician, physical therapist, or athletic trainer. Progress the resistance and repetitions only as guided.  You may experience muscle soreness or fatigue, but the pain or discomfort you are trying to eliminate should never worsen during these exercises. If this pain does worsen, stop and make certain you are following the directions exactly. If the pain is still present after adjustments, discontinue the exercise until you can discuss the trouble with your clinician. STRENGTH - Dorsiflexors  Secure a rubber exercise band/tubing to a fixed object (i.e., table, pole) and loop the other end around your right / left foot.  Sit on the floor facing the fixed object. The band/tubing should be slightly tense when your foot is relaxed.  Slowly draw your foot back toward you using your ankle and toes.  Hold this position for __________ seconds. Slowly release the tension in the band and return your foot to the starting position. Repeat __________ times. Complete this exercise __________ times per day.  STRENGTH - Plantar-flexors   Sit with your right / left leg extended. Holding onto both ends of a rubber exercise band/tubing, loop it around the ball of your foot. Keep a slight tension in the band.  Slowly push your toes away from you, pointing them downward.  Hold this position for __________ seconds. Return slowly, controlling the tension in the band/tubing. Repeat __________ times. Complete this exercise __________ times per day.  STRENGTH - Ankle Eversion  Secure one end of a rubber exercise band/tubing to a fixed object (table, pole). Loop the other end around your foot just before your toes.  Place your fists between your knees. This will focus your strengthening at your ankle.  Drawing the band/tubing across your opposite foot, slowly, pull your little toe out and up. Make sure the band/tubing is positioned to resist the entire motion.  Hold this position for __________ seconds. Have  your muscles resist the band/tubing as it slowly pulls your foot back to the starting position.  Repeat __________ times. Complete this exercise __________ times per day.  STRENGTH - Ankle Inversion  Secure one end of a rubber exercise band/tubing to a fixed object (table, pole). Loop the other end around your foot just before your toes.  Place your fists between your knees. This will focus your strengthening at your ankle.  Slowly, pull your big toe up and in, making sure the band/tubing is positioned to resist the entire motion.  Hold this position for __________ seconds.  Have your muscles resist the band/tubing as it slowly pulls your foot back to the starting position. Repeat __________ times. Complete this exercises __________ times per day.  STRENGTH - Towel Curls  Sit in a chair positioned on a non-carpeted surface.  Place your right / left foot on a towel, keeping your heel on the floor.  Pull the towel toward your heel by only curling your toes. Keep your heel on the floor.  If instructed by your physician, physical therapist,   or athletic trainer, add weight to the end of the towel. Repeat __________ times. Complete this exercise __________ times per day.   This information is not intended to replace advice given to you by your health care provider. Make sure you discuss any questions you have with your health care provider.   Document Released: 12/27/2004 Document Revised: 06/18/2014 Document Reviewed: 09/09/2008 Elsevier Interactive Patient Education 2016 [employer]  Cryotherapy Cryotherapy is when you put ice on your injury. Ice helps lessen pain and puffiness (swelling) after an injury. Ice works the best when you start using it in the first 24 to 48 hours after an injury. HOME CARE  Put a dry or damp towel between the ice pack and your skin.  You may press gently on the ice pack.  Leave the ice on for no more than 10 to 20 minutes at a time.  Check your skin  after 5 minutes to make sure your skin is okay.  Rest at least 20 minutes between ice pack uses.  Stop using ice when your skin loses feeling (numbness).  Do not use ice on someone who cannot tell you when it hurts. This includes small children and people with memory problems (dementia). GET HELP RIGHT AWAY IF:  You have white spots on your skin.  Your skin turns blue or pale.  Your skin feels waxy or hard.  Your puffiness gets worse. MAKE SURE YOU:   Understand these instructions.  Will watch your condition.  Will get help right away if you are not doing well or get worse.   This information is not intended to replace advice given to you by your health care provider. Make sure you discuss any questions you have with your health care provider.   Document Released: 11/14/2007 Document Revised: 08/20/2011 Document Reviewed: 01/18/2011 Elsevier Interactive Patient Education [employer].
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
## 2015-10-04 ENCOUNTER — Encounter (HOSPITAL_COMMUNITY): Payer: Self-pay

## 2015-10-04 ENCOUNTER — Emergency Department (HOSPITAL_COMMUNITY): Payer: Self-pay

## 2015-10-04 ENCOUNTER — Emergency Department (HOSPITAL_COMMUNITY)
Admission: EM | Admit: 2015-10-04 | Discharge: 2015-10-04 | Disposition: A | Discharge status: Home / Self Care | Payer: Self-pay | Attending: Emergency Medicine | Admitting: Emergency Medicine

## 2015-10-04 DIAGNOSIS — S134XXA Sprain of ligaments of cervical spine, initial encounter: Secondary | ICD-10-CM | POA: Insufficient documentation

## 2015-10-04 DIAGNOSIS — F17200 Nicotine dependence, unspecified, uncomplicated: Secondary | ICD-10-CM | POA: Insufficient documentation

## 2015-10-04 DIAGNOSIS — S3992XA Unspecified injury of lower back, initial encounter: Secondary | ICD-10-CM | POA: Insufficient documentation

## 2015-10-04 DIAGNOSIS — G43909 Migraine, unspecified, not intractable, without status migrainosus: Secondary | ICD-10-CM | POA: Insufficient documentation

## 2015-10-04 DIAGNOSIS — Y998 Other external cause status: Secondary | ICD-10-CM | POA: Insufficient documentation

## 2015-10-04 DIAGNOSIS — M25561 Pain in right knee: Secondary | ICD-10-CM

## 2015-10-04 DIAGNOSIS — S4992XA Unspecified injury of left shoulder and upper arm, initial encounter: Secondary | ICD-10-CM | POA: Insufficient documentation

## 2015-10-04 DIAGNOSIS — Y92410 Unspecified street and highway as the place of occurrence of the external cause: Secondary | ICD-10-CM | POA: Insufficient documentation

## 2015-10-04 DIAGNOSIS — Y9389 Activity, other specified: Secondary | ICD-10-CM | POA: Insufficient documentation

## 2015-10-04 DIAGNOSIS — S8991XA Unspecified injury of right lower leg, initial encounter: Secondary | ICD-10-CM | POA: Insufficient documentation

## 2015-10-04 DIAGNOSIS — M25512 Pain in left shoulder: Secondary | ICD-10-CM

## 2015-10-04 MED ORDER — NAPROXEN 500 MG PO TABS: 500.0000 mg | ORAL_TABLET | Freq: Two times a day (BID) | ORAL | Status: DC

## 2015-10-04 MED ORDER — IBUPROFEN 800 MG PO TABS
800.0000 mg | ORAL_TABLET | Freq: Once | ORAL | Status: AC
  Administered 2015-10-04: 800 mg via ORAL
  Filled 2015-10-04: qty 1

## 2015-10-04 MED ORDER — CYCLOBENZAPRINE HCL 10 MG PO TABS: 5.0000 mg | ORAL_TABLET | Freq: Two times a day (BID) | ORAL | Status: DC | PRN

## 2015-10-04 NOTE — ED Notes (Signed)
Patient transported to X-ray 
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
## 2015-10-04 NOTE — ED Notes (Signed)
Patient was alert, oriented and stable upon discharge. RN went over AVS and patient had no further questions.  
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
## 2015-12-08 ENCOUNTER — Encounter (HOSPITAL_COMMUNITY): Payer: Self-pay

## 2015-12-08 ENCOUNTER — Emergency Department (HOSPITAL_COMMUNITY)
Admission: EM | Admit: 2015-12-08 | Discharge: 2015-12-08 | Disposition: A | Discharge status: Home / Self Care | Attending: Emergency Medicine | Admitting: Emergency Medicine

## 2015-12-08 DIAGNOSIS — Z792 Long term (current) use of antibiotics: Secondary | ICD-10-CM | POA: Insufficient documentation

## 2015-12-08 DIAGNOSIS — Z79899 Other long term (current) drug therapy: Secondary | ICD-10-CM | POA: Insufficient documentation

## 2015-12-08 DIAGNOSIS — D290 Benign neoplasm of penis: Secondary | ICD-10-CM | POA: Insufficient documentation

## 2015-12-08 DIAGNOSIS — F17200 Nicotine dependence, unspecified, uncomplicated: Secondary | ICD-10-CM | POA: Insufficient documentation

## 2015-12-08 DIAGNOSIS — Z791 Long term (current) use of non-steroidal anti-inflammatories (NSAID): Secondary | ICD-10-CM | POA: Insufficient documentation

## 2015-12-08 DIAGNOSIS — Z202 Contact with and (suspected) exposure to infections with a predominantly sexual mode of transmission: Secondary | ICD-10-CM

## 2015-12-08 DIAGNOSIS — N4889 Other specified disorders of penis: Secondary | ICD-10-CM

## 2015-12-08 LAB — URINALYSIS, ROUTINE W REFLEX MICROSCOPIC
BILIRUBIN URINE: NEGATIVE
GLUCOSE, UA: NEGATIVE mg/dL
HGB URINE DIPSTICK: NEGATIVE
Ketones, ur: NEGATIVE mg/dL
Leukocytes, UA: NEGATIVE
Nitrite: NEGATIVE
PH: 6 (ref 5.0–8.0)
Protein, ur: NEGATIVE mg/dL
SPECIFIC GRAVITY, URINE: 1.029 (ref 1.005–1.030)

## 2015-12-08 NOTE — ED Notes (Addendum)
Pt presents with c/o penile itching. Pt reports he is sexually active, uses protection. No discharge reported. Pt also reporting some bumps on his penis.

## 2015-12-08 NOTE — ED Notes (Signed)
PT DISCHARGED. INSTRUCTIONS GIVEN. AAOX4. PT IN NO APPARENT DISTRESS OR PAIN. THE OPPORTUNITY TO ASK QUESTIONS WAS PROVIDED. 

## 2015-12-08 NOTE — Discharge Instructions (Signed)
Please read and follow all provided instructions.  Your diagnoses today include:  1. Pearly penile papules   2. Possible exposure to STD    Tests performed today include:  Vital signs. See below for your results today.   Medications prescribed:   Take as prescribed   Home care instructions:  Follow any educational materials contained in this packet.  Follow-up instructions: Please follow-up with your primary care provider for further evaluation of symptoms and treatment   Return instructions:   Please return to the Emergency Department if you do not get better, if you get worse, or new symptoms OR  - Fever (temperature greater than 101.65F)  - Bleeding that does not stop with holding pressure to the area    -Severe pain (please note that you may be more sore the day after your accident)  - Chest Pain  - Difficulty breathing  - Severe nausea or vomiting  - Inability to tolerate food and liquids  - Passing out  - Skin becoming red around your wounds  - Change in mental status (confusion or lethargy)  - New numbness or weakness     Please return if you have any other emergent concerns.  Additional Information:  Your vital signs today were: BP 132/77 mmHg   Pulse 90   Temp(Src) 98.7 F (37.1 C) (Oral)   Resp 20   SpO2 99% If your blood pressure (BP) was elevated above 135/85 this visit, please have this repeated by your doctor within one month. ---------------

## 2015-12-08 NOTE — ED Provider Notes (Signed)
CSN: [id number]     Arrival date & time 12/08/15  [id number] History  By signing my name below, I, Bradley Larson, attest that this documentation has been prepared under the direction and in the presence of Shary Decamp, PA-C. Electronically Signed: Irene Larson, ED Scribe. 12/08/2015. 8:55 PM.   Chief Complaint  Patient presents with  . Penile itching    The history is provided by the patient. No language interpreter was used.  HPI Comments: Bradley Larson is a 20 y.o. male who presents to the Emergency Department complaining of penile itching onset earlier this morning. He states that he noticed diffuse bumps on the shaft of the penis. Pt reports that he is sexually active but uses protection. Pt is monogamous with one partner. He denies personal hx of STDs or knowledge of partner having STDs. He has not tried anything for his symptoms. He denies fever, chills, abdominal pain, nausea, vomiting, dysuria, penile discharge, penile swelling, scrotal swelling, penile pain, or testicular pain.   Past Medical History  Diagnosis Date  . Meniscus tear   . Migraines    Past Surgical History  Procedure Laterality Date  . Fracture surgery Right     thumb   Family History  Problem Relation Age of Onset  . Diabetes Other   . Hypertension Other    Social History  Substance Use Topics  . Smoking status: Current Some Day Smoker  . Smokeless tobacco: Never Used  . Alcohol Use: Yes    Review of Systems  Constitutional: Negative for fever and chills.  Gastrointestinal: Negative for nausea, vomiting and abdominal pain.  Genitourinary: Negative for dysuria, discharge, penile swelling, scrotal swelling, penile pain and testicular pain.       Penile itching   Allergies  Review of patient's allergies indicates no known allergies.  Home Medications   Prior to Admission medications   Medication Sig Start Date End Date Taking? Authorizing Provider  Alum & Mag Hydroxide-Simeth (MAGIC MOUTHWASH  W/LIDOCAINE) SOLN Take 5 mLs by mouth 4 (four) times daily as needed for mouth pain. Patient not taking: Reported on 11/01/2014 09/30/14   Larene Pickett, PA-C  amoxicillin (AMOXIL) 500 MG capsule Take 1 capsule (500 mg total) by mouth 3 (three) times daily. Patient not taking: Reported on 11/01/2014 06/16/14   Lajean Saver, MD  cyclobenzaprine (FLEXERIL) 10 MG tablet Take 0.5-1 tablets (5-10 mg total) by mouth 2 (two) times daily as needed for muscle spasms. 10/04/15   Delos Haring, PA-C  HYDROcodone-acetaminophen (NORCO/VICODIN) 5-325 MG per tablet Take 1 tablet by mouth every 6 (six) hours as needed for moderate pain. Patient not taking: Reported on 06/18/2014 04/08/14   Evelina Bucy, MD  ibuprofen (ADVIL,MOTRIN) 600 MG tablet Take 1 tablet (600 mg total) by mouth every 6 (six) hours as needed. 09/05/15   Junius Creamer, NP  naproxen (NAPROSYN) 500 MG tablet Take 1 tablet (500 mg total) by mouth 2 (two) times daily with a meal. Patient not taking: Reported on 11/01/2014 06/18/14   Ernestina Patches, MD  naproxen (NAPROSYN) 500 MG tablet Take 1 tablet (500 mg total) by mouth 2 (two) times daily. 10/04/15   Tiffany Carlota Raspberry, PA-C  phenol (CHLORASEPTIC) 1.4 % LIQD Use as directed 1 spray in the mouth or throat as needed for throat irritation / pain. Patient not taking: Reported on 11/01/2014 08/02/14   Noland Fordyce, PA-C  polyethylene glycol powder (GLYCOLAX/MIRALAX) powder Take 17 g by mouth 2 (two) times daily. Until daily soft stools  OTC Patient  not taking: Reported on 10/04/2015 07/01/15   Montine Circle, PA-C  sodium chloride (OCEAN) 0.65 % SOLN nasal spray Place 2 sprays into both nostrils as needed for congestion. Patient not taking: Reported on 10/04/2015 08/02/14   Noland Fordyce, PA-C   BP 132/77 mmHg  Pulse 90  Temp(Src) 98.7 F (37.1 C) (Oral)  Resp 20  SpO2 99%   Physical Exam  Constitutional: He is oriented to person, place, and time. He appears well-developed and well-nourished.  HENT:  Head:  Normocephalic and atraumatic.  Eyes: EOM are normal. Pupils are equal, round, and reactive to light.  Neck: Normal range of motion. Neck supple.  Cardiovascular: Normal rate and regular rhythm.   Pulmonary/Chest: Effort normal and breath sounds normal.  Abdominal: Soft. There is no tenderness.  Genitourinary: Testes normal and penis normal. Circumcised. No discharge found.  Scattered, >1 mm clear vesicles noted on the anterior aspect on the shaft of the penis, no erythema or signs of infection; glands are without any lesions  Musculoskeletal: Normal range of motion.  Neurological: He is alert and oriented to person, place, and time.  Skin: Skin is warm and dry.  Psychiatric: He has a normal mood and affect. His behavior is normal.  Nursing note and vitals reviewed.   ED Course  Procedures (including critical care time) DIAGNOSTIC STUDIES: Oxygen Saturation is 99% on RA, normal by my interpretation.    COORDINATION OF CARE: 8:48 PM-Discussed treatment plan which includes labs with pt at bedside and pt agreed to plan.   Labs Review Labs Reviewed  URINE CULTURE  URINALYSIS, ROUTINE W REFLEX MICROSCOPIC (NOT AT [facility])  GC/CHLAMYDIA PROBE AMP ([place]) NOT AT [facility]   Imaging Review No results found. I have personally reviewed and evaluated these images and lab results as part of my medical decision-making.   EKG Interpretation None      MDM  I have reviewed and evaluated the relevant laboratory values I have reviewed the relevant previous healthcare records.I obtained HPI from historian.  ED Course:  Assessment: Pt is a 20yM who presents with pearly penile papules x 1 day. On exam, pt in NAD. Nontoxic/nonseptic appearing. VSS. Afebrile.GU exam without penile discharge. Pearly penile papules noted on anterior aspect of shaft of penis. Glans spared. UA obtained and unremarkable. GC Obtained. Will withhold treatment for GC as patient is asymptomatic other than papules. Told  patient to come to ED if we call with positive result. Plan is to DC home with follow up to PCP. Likely pearly penile papules. At time of discharge, Patient is in no acute distress. Vital Signs are stable. Patient is able to ambulate. Patient able to tolerate PO.   Disposition/Plan:  DC Home Additional Verbal discharge instructions given and discussed with patient.  Pt Instructed to f/u with PCP in the next week for evaluation and treatment of symptoms. Return precautions given Pt acknowledges and agrees with plan  Supervising Physician Quintella Reichert, MD   Final diagnoses:  Pearly penile papules  Possible exposure to STD   I personally performed the services described in this documentation, which was scribed in my presence. The recorded information has been reviewed and is accurate.   Shary Decamp, PA-C 12/08/15 2119  Quintella Reichert, MD 12/10/15 [phone]

## 2015-12-09 LAB — GC/CHLAMYDIA PROBE AMP (~~LOC~~) NOT AT ARMC
CHLAMYDIA, DNA PROBE: NEGATIVE
Neisseria Gonorrhea: NEGATIVE

## 2015-12-10 LAB — URINE CULTURE: Culture: <10000 — AB

## 2015-12-20 ENCOUNTER — Encounter (HOSPITAL_COMMUNITY): Payer: Self-pay | Admitting: *Deleted

## 2015-12-20 ENCOUNTER — Emergency Department (HOSPITAL_COMMUNITY)
Admission: EM | Admit: 2015-12-20 | Discharge: 2015-12-21 | Disposition: A | Discharge status: Home / Self Care | Attending: Emergency Medicine | Admitting: Emergency Medicine

## 2015-12-20 DIAGNOSIS — Z79899 Other long term (current) drug therapy: Secondary | ICD-10-CM | POA: Insufficient documentation

## 2015-12-20 DIAGNOSIS — Z791 Long term (current) use of non-steroidal anti-inflammatories (NSAID): Secondary | ICD-10-CM | POA: Diagnosis not present

## 2015-12-20 DIAGNOSIS — F17200 Nicotine dependence, unspecified, uncomplicated: Secondary | ICD-10-CM | POA: Insufficient documentation

## 2015-12-20 DIAGNOSIS — J029 Acute pharyngitis, unspecified: Secondary | ICD-10-CM

## 2015-12-20 DIAGNOSIS — N4889 Other specified disorders of penis: Secondary | ICD-10-CM | POA: Insufficient documentation

## 2015-12-20 DIAGNOSIS — Z792 Long term (current) use of antibiotics: Secondary | ICD-10-CM | POA: Diagnosis not present

## 2015-12-20 LAB — RAPID STREP SCREEN (MED CTR MEBANE ONLY): Streptococcus, Group A Screen (Direct): NEGATIVE

## 2015-12-20 NOTE — ED Notes (Signed)
Pt complains of sore throat, headache, generalized weakness for the past 3 days. Pt also complains of dry, cracked skin around his penis. Pt states he had his penis evaluated in late June but states he was not given anything for it.

## 2015-12-21 ENCOUNTER — Encounter (HOSPITAL_COMMUNITY): Payer: Self-pay | Admitting: Emergency Medicine

## 2015-12-21 MED ORDER — MICONAZOLE NITRATE 2 % EX CREA: 1.0000 "application " | TOPICAL_CREAM | Freq: Two times a day (BID) | CUTANEOUS | Status: DC

## 2015-12-21 MED ORDER — IBUPROFEN 800 MG PO TABS
800.0000 mg | ORAL_TABLET | Freq: Once | ORAL | Status: AC
  Administered 2015-12-21: 800 mg via ORAL
  Filled 2015-12-21: qty 1

## 2015-12-21 NOTE — Discharge Instructions (Signed)

## 2015-12-21 NOTE — ED Provider Notes (Signed)
CSN: [id number]     Arrival date & time 12/20/15  2146 History  By signing my name below, I, Bradley Larson, attest that this documentation has been prepared under the direction and in the presence of Embry Manrique, MD. Electronically Signed: Judithann Sauger, ED Scribe. 12/21/2015. 2:02 AM.    Chief Complaint  Patient presents with  . Sore Throat  . Penis Pain  . Headache   Patient is a 20 y.o. male presenting with pharyngitis. The history is provided by the patient. No language interpreter was used.  Sore Throat This is a new problem. The current episode started more than 2 days ago. The problem occurs constantly. Pertinent negatives include no chest pain and no abdominal pain. Nothing aggravates the symptoms. Nothing relieves the symptoms. He has tried nothing for the symptoms. The treatment provided no relief.   HPI Comments: Bradley Larson is a 20 y.o. male who presents to the Emergency Department complaining of gradually worsening penile pain onset two weeks ago. Pt notes that his penis is dry which cracks and tears when he has an erection. He reports associated sore throat onset 2 days ago. Pt was seen on 12/08/15 for the penile symptoms and had a negative GC so he was not prescribed any medications. No alleviating factors noted. He states that he has tried Vaseline with no relief. He denies any fever, cough, rhinorrhea, abdominal pain, n/v/d, dysuria, penile discharge, or testicular pain.    Past Medical History  Diagnosis Date  . Meniscus tear   . Migraines    Past Surgical History  Procedure Laterality Date  . Fracture surgery Right     thumb   Family History  Problem Relation Age of Onset  . Diabetes Other   . Hypertension Other    Social History  Substance Use Topics  . Smoking status: Current Some Day Smoker  . Smokeless tobacco: Never Used  . Alcohol Use: Yes    Review of Systems  Constitutional: Negative for fever.  HENT: Positive for sore throat. Negative  for rhinorrhea.   Respiratory: Negative for cough.   Cardiovascular: Negative for chest pain.  Gastrointestinal: Negative for nausea, vomiting, abdominal pain and diarrhea.  Genitourinary: Positive for penile pain. Negative for dysuria and testicular pain.  All other systems reviewed and are negative.     Allergies  Review of patient's allergies indicates no known allergies.  Home Medications   Prior to Admission medications   Medication Sig Start Date End Date Taking? Authorizing Provider  Alum & Mag Hydroxide-Simeth (MAGIC MOUTHWASH W/LIDOCAINE) SOLN Take 5 mLs by mouth 4 (four) times daily as needed for mouth pain. Patient not taking: Reported on 11/01/2014 09/30/14   Larene Pickett, PA-C  amoxicillin (AMOXIL) 500 MG capsule Take 1 capsule (500 mg total) by mouth 3 (three) times daily. Patient not taking: Reported on 11/01/2014 06/16/14   Lajean Saver, MD  cyclobenzaprine (FLEXERIL) 10 MG tablet Take 0.5-1 tablets (5-10 mg total) by mouth 2 (two) times daily as needed for muscle spasms. 10/04/15   Delos Haring, PA-C  HYDROcodone-acetaminophen (NORCO/VICODIN) 5-325 MG per tablet Take 1 tablet by mouth every 6 (six) hours as needed for moderate pain. Patient not taking: Reported on 06/18/2014 04/08/14   Evelina Bucy, MD  ibuprofen (ADVIL,MOTRIN) 600 MG tablet Take 1 tablet (600 mg total) by mouth every 6 (six) hours as needed. 09/05/15   Junius Creamer, NP  naproxen (NAPROSYN) 500 MG tablet Take 1 tablet (500 mg total) by mouth 2 (two) times  daily with a meal. Patient not taking: Reported on 11/01/2014 06/18/14   Ernestina Patches, MD  naproxen (NAPROSYN) 500 MG tablet Take 1 tablet (500 mg total) by mouth 2 (two) times daily. 10/04/15   Tiffany Carlota Raspberry, PA-C  phenol (CHLORASEPTIC) 1.4 % LIQD Use as directed 1 spray in the mouth or throat as needed for throat irritation / pain. Patient not taking: Reported on 11/01/2014 08/02/14   Noland Fordyce, PA-C  polyethylene glycol powder (GLYCOLAX/MIRALAX) powder  Take 17 g by mouth 2 (two) times daily. Until daily soft stools  OTC Patient not taking: Reported on 10/04/2015 07/01/15   Montine Circle, PA-C  sodium chloride (OCEAN) 0.65 % SOLN nasal spray Place 2 sprays into both nostrils as needed for congestion. Patient not taking: Reported on 10/04/2015 08/02/14   Noland Fordyce, PA-C   BP 140/69 mmHg  Pulse 85  Temp(Src) 99.1 F (37.3 C) (Oral)  Ht 6\' 3"  (1.905 m)  Wt 302 lb (136.986 kg)  BMI 37.75 kg/m2  SpO2 94% Physical Exam  Constitutional: He is oriented to person, place, and time. He appears well-developed and well-nourished. No distress.  HENT:  Head: Normocephalic and atraumatic.  Mouth/Throat: Oropharynx is clear and moist. No oropharyngeal exudate.  No swelling on lips, tongue, or uvula  Trachea midline  Eyes: Conjunctivae and EOM are normal. Pupils are equal, round, and reactive to light.  Neck: Neck supple. No tracheal deviation present.  No bruits  Cardiovascular: Normal rate and regular rhythm.   Pulmonary/Chest: Effort normal. No stridor. No respiratory distress. He has no wheezes. He has no rales.  Abdominal: Soft. Bowel sounds are normal. There is no tenderness. There is no rebound and no guarding.  Genitourinary: No penile tenderness.  No ulcerations, there is some irritation at head of penis  Musculoskeletal: Normal range of motion.  Lymphadenopathy:    He has no cervical adenopathy.  Neurological: He is alert and oriented to person, place, and time.  Skin: Skin is warm and dry.  Psychiatric: He has a normal mood and affect. His behavior is normal.  Nursing note and vitals reviewed.   ED Course  Procedures (including critical care time) DIAGNOSTIC STUDIES: Oxygen Saturation is 94% on RA, normal by my interpretation.    COORDINATION OF CARE: 1:55 AM- Pt advised of plan for treatment and pt agrees. Pt informed of his strep test. Advised to use Tylenol for pain relief. Will provide resources for Nephrology follow up.     Labs Review Labs Reviewed  RAPID STREP SCREEN (NOT AT [facility])  CULTURE, GROUP A STREP [facility])    Imaging Review No results found.   Jakye Mullens, MD has personally reviewed and evaluated these images and lab results as part of her medical decision-making.  MDM   Final diagnoses:  None    Filed Vitals:   12/20/15 2150  BP: 140/69  Pulse: 85  Temp: 99.1 F (37.3 C)   Results for orders placed or performed during the hospital encounter of 12/20/15  Rapid strep screen  Result Value Ref Range   Streptococcus, Group A Screen (Direct) NEGATIVE NEGATIVE   No results found.  Medications  ibuprofen (ADVIL,MOTRIN) tablet 800 mg (not administered)   Well appearing, based on [facility] criteria no indication for further testing or treatment.  No signs of of infection recent negative cultures for same issue with penis.  No more vaseline.  Use miconazole and follow up with urology.  Strict return precautions given    I personally performed the services described  in this documentation, which was scribed in my presence. The recorded information has been reviewed and is accurate.     Veatrice Kells, MD 12/21/15 [phone]

## 2015-12-23 LAB — CULTURE, GROUP A STREP (THRC)

## 2016-01-09 ENCOUNTER — Emergency Department (HOSPITAL_COMMUNITY)

## 2016-01-09 ENCOUNTER — Emergency Department (HOSPITAL_COMMUNITY)
Admission: EM | Admit: 2016-01-09 | Discharge: 2016-01-09 | Disposition: A | Discharge status: Home / Self Care | Attending: Emergency Medicine | Admitting: Emergency Medicine

## 2016-01-09 ENCOUNTER — Encounter (HOSPITAL_COMMUNITY): Payer: Self-pay | Admitting: Emergency Medicine

## 2016-01-09 DIAGNOSIS — S86811A Strain of other muscle(s) and tendon(s) at lower leg level, right leg, initial encounter: Secondary | ICD-10-CM | POA: Insufficient documentation

## 2016-01-09 DIAGNOSIS — Y998 Other external cause status: Secondary | ICD-10-CM | POA: Insufficient documentation

## 2016-01-09 DIAGNOSIS — S86911A Strain of unspecified muscle(s) and tendon(s) at lower leg level, right leg, initial encounter: Secondary | ICD-10-CM

## 2016-01-09 DIAGNOSIS — Z79899 Other long term (current) drug therapy: Secondary | ICD-10-CM | POA: Insufficient documentation

## 2016-01-09 DIAGNOSIS — Z792 Long term (current) use of antibiotics: Secondary | ICD-10-CM | POA: Insufficient documentation

## 2016-01-09 DIAGNOSIS — Y929 Unspecified place or not applicable: Secondary | ICD-10-CM | POA: Insufficient documentation

## 2016-01-09 DIAGNOSIS — M25561 Pain in right knee: Secondary | ICD-10-CM

## 2016-01-09 DIAGNOSIS — X58XXXA Exposure to other specified factors, initial encounter: Secondary | ICD-10-CM | POA: Insufficient documentation

## 2016-01-09 DIAGNOSIS — Y9367 Activity, basketball: Secondary | ICD-10-CM | POA: Insufficient documentation

## 2016-01-09 DIAGNOSIS — M1711 Unilateral primary osteoarthritis, right knee: Secondary | ICD-10-CM | POA: Insufficient documentation

## 2016-01-09 DIAGNOSIS — F17200 Nicotine dependence, unspecified, uncomplicated: Secondary | ICD-10-CM | POA: Insufficient documentation

## 2016-01-09 DIAGNOSIS — M1710 Unilateral primary osteoarthritis, unspecified knee: Secondary | ICD-10-CM

## 2016-01-09 NOTE — ED Notes (Signed)
Off floor for testing 

## 2016-01-09 NOTE — ED Triage Notes (Signed)
Pt reports he began to have R knee pain after landing wrong while playing basketball yesterday. Pt ambulatory to triage.

## 2016-01-09 NOTE — ED Provider Notes (Signed)
[facility] DEPT Provider Note  By signing my name below, I, [name], attest that this documentation has been prepared under the direction and in the presence of Treatment Team:  Attending Provider: Daleen Bo, MD Physician Assistant: Zacarias Pontes, PA-C.  Electronically Signed: Verlee Monte, Medical Scribe. 01/09/16. 4:28 PM.   CSN: [id number] Arrival date & time: 01/09/16  1540  First Provider Contact:  None     History   Chief Complaint Chief Complaint  Patient presents with  . Knee Pain    HPI Comments: Bradley Larson is a 20 y.o. male with a PMHx of R knee meniscal injury, who presents to the Emergency Department complaining of gradual onset R knee pain onset yesterday after playing basketball. Pt states he landed on it funny when pain occurred. He describes the pain as 8/10 constant sharp nonradiating pain in the R knee, that worsens when he bends his knee or walks on it. He states that he has tried running hot water over his knee with no relief for his symptoms. Pt slightly torn his meniscus in high school. He denies fevers, chills, CP, SOB, abd pain, N/V/D/C, hematuria, dysuria, myalgias, knee swelling, bruising, warmth/erythema to knee, numbness, tingling, weakness, wound, or any other symptoms.   The history is provided by the patient. No language interpreter was used.    Past Medical History:  Diagnosis Date  . Meniscus tear   . Migraines     There are no active problems to display for this patient.   Past Surgical History:  Procedure Laterality Date  . FRACTURE SURGERY Right    thumb       Home Medications    Prior to Admission medications   Medication Sig Start Date End Date Taking? Authorizing Provider  Alum & Mag Hydroxide-Simeth (MAGIC MOUTHWASH W/LIDOCAINE) SOLN Take 5 mLs by mouth 4 (four) times daily as needed for mouth pain. Patient not taking: Reported on 11/01/2014 09/30/14   Larene Pickett, PA-C  amoxicillin (AMOXIL)  500 MG capsule Take 1 capsule (500 mg total) by mouth 3 (three) times daily. Patient not taking: Reported on 11/01/2014 06/16/14   Lajean Saver, MD  cyclobenzaprine (FLEXERIL) 10 MG tablet Take 0.5-1 tablets (5-10 mg total) by mouth 2 (two) times daily as needed for muscle spasms. 10/04/15   Delos Haring, PA-C  HYDROcodone-acetaminophen (NORCO/VICODIN) 5-325 MG per tablet Take 1 tablet by mouth every 6 (six) hours as needed for moderate pain. Patient not taking: Reported on 06/18/2014 04/08/14   Evelina Bucy, MD  ibuprofen (ADVIL,MOTRIN) 600 MG tablet Take 1 tablet (600 mg total) by mouth every 6 (six) hours as needed. 09/05/15   Junius Creamer, NP  miconazole (MICOTIN) 2 % cream Apply 1 application topically 2 (two) times daily. 12/21/15   April Palumbo, MD  naproxen (NAPROSYN) 500 MG tablet Take 1 tablet (500 mg total) by mouth 2 (two) times daily with a meal. Patient not taking: Reported on 11/01/2014 06/18/14   Ernestina Patches, MD  naproxen (NAPROSYN) 500 MG tablet Take 1 tablet (500 mg total) by mouth 2 (two) times daily. 10/04/15   Tiffany Carlota Raspberry, PA-C  phenol (CHLORASEPTIC) 1.4 % LIQD Use as directed 1 spray in the mouth or throat as needed for throat irritation / pain. Patient not taking: Reported on 11/01/2014 08/02/14   Noland Fordyce, PA-C  polyethylene glycol powder (GLYCOLAX/MIRALAX) powder Take 17 g by mouth 2 (two) times daily. Until daily soft stools  OTC Patient not taking: Reported on 10/04/2015 07/01/15   Herbie Baltimore  Browning, PA-C  sodium chloride (OCEAN) 0.65 % SOLN nasal spray Place 2 sprays into both nostrils as needed for congestion. Patient not taking: Reported on 10/04/2015 08/02/14   Noland Fordyce, PA-C    Family History Family History  Problem Relation Age of Onset  . Diabetes Other   . Hypertension Other     Social History Social History  Substance Use Topics  . Smoking status: Current Some Day Smoker  . Smokeless tobacco: Never Used  . Alcohol use Yes     Allergies   Review  of patient's allergies indicates no known allergies.   Review of Systems Review of Systems  Constitutional: Negative for chills and fever.  Respiratory: Negative for shortness of breath.   Cardiovascular: Negative for chest pain and leg swelling.  Gastrointestinal: Negative for abdominal pain, constipation, diarrhea, nausea and vomiting.  Genitourinary: Negative for dysuria and hematuria.  Musculoskeletal: Positive for arthralgias. Negative for joint swelling and myalgias.  Skin: Negative for color change and wound.  Allergic/Immunologic: Negative for immunocompromised state.  Neurological: Negative for weakness and numbness.  Psychiatric/Behavioral: Negative for confusion.  A complete 10 system review of systems was obtained and all systems are negative except as noted in the HPI and PMH.   Physical Exam Updated Vital Signs BP 140/84   Pulse 91   Temp 98.2 F (36.8 C) (Oral)   Resp 16   SpO2 95%   Physical Exam  Constitutional: He is oriented to person, place, and time. Vital signs are normal. He appears well-developed and well-nourished.  Non-toxic appearance. No distress.  Afebrile, nontoxic, NAD  HENT:  Head: Normocephalic and atraumatic.  Mouth/Throat: Mucous membranes are normal.  Eyes: Conjunctivae and EOM are normal. Right eye exhibits no discharge. Left eye exhibits no discharge.  Neck: Normal range of motion. Neck supple.  Cardiovascular: Normal rate and intact distal pulses.   Pulmonary/Chest: Effort normal. No respiratory distress.  Abdominal: Normal appearance. He exhibits no distension.  Musculoskeletal: Normal range of motion.       Right knee: He exhibits normal range of motion, no swelling, no effusion, no ecchymosis, no deformity, no laceration, no erythema, normal alignment, no LCL laxity, normal patellar mobility and no MCL laxity. Tenderness found. Lateral joint line tenderness noted.  Right knee with FROM intact, with mild lateral joint line TTP, no  swelling/effusion/deformity, no bruising or erythema, no warmth, no abnormal alignment or patellar mobility, no varus/valgus laxity, neg anterior drawer test, no crepitus.  Strength and sensation grossly intact, distal pulses intact, compartments soft  Neurological: He is alert and oriented to person, place, and time. He has normal strength. No sensory deficit.  Skin: Skin is warm, dry and intact. No rash noted.  Psychiatric: He has a normal mood and affect.  Nursing note and vitals reviewed.  ED Treatments / Results  Labs (all labs ordered are listed, but only abnormal results are displayed) Labs Reviewed - No data to display  DIAGNOSTIC STUDIES: Oxygen Saturation is 95% on RA, nl by my interpretation.    COORDINATION OF CARE: 4:29 PM Discussed treatment plan with pt at bedside and pt agreed to plan.  Radiology Dg Knee Complete 4 Views Right  Result Date: 01/09/2016 CLINICAL DATA:  Right knee pain after basketball injury yesterday. Initial encounter. EXAM: RIGHT KNEE - COMPLETE 4+ VIEW COMPARISON:  October 04, 2015. FINDINGS: No evidence of fracture, dislocation, or joint effusion. Joint spaces are intact. Osteophyte formation is noted laterally. Soft tissues are unremarkable. IMPRESSION: Mild degenerative changes as described  above. No acute abnormality seen in the right knee. Electronically Signed   By: Marijo Conception, M.D.   On: 01/09/2016 16:50    Procedures Procedures (including critical care time)  Medications Ordered in ED Medications - No data to display   Initial Impression / Assessment and Plan / ED Course  I have reviewed the triage vital signs and the nursing notes.  Pertinent labs & imaging results that were available during my care of the patient were reviewed by me and considered in my medical decision making (see chart for details).  Clinical Course    20 y.o. male here with R knee pain s/p landing on it wrong while playing basketball yesterday. Prior injury to  meniscus in same knee. NVI with soft compartments. FROM intact. No warmth/erythema, doubt septic joint. Mild jointline tenderness, will obtain xray. Pt declined pain meds. Will reassess shortly.  5:08 PM Xray neg aside from mild arthritic changes. Will apply knee sleeve and give crutches for comfort. RICE discussed. Tylenol/motrin discussed. F/up with ortho in 1-2wks. I explained the diagnosis and have given explicit precautions to return to the ER including for any other new or worsening symptoms. The patient understands and accepts the medical plan as it's been dictated and I have answered their questions. Discharge instructions concerning home care and prescriptions have been given. The patient is STABLE and is discharged to home in good condition.   Final Clinical Impressions(s) / ED Diagnoses   Final diagnoses:  Right knee pain  Knee strain, right, initial encounter  Arthritis of knee    New Prescriptions New Prescriptions   No medications on file    I personally performed the services described in this documentation, which was scribed in my presence. The recorded information has been reviewed and is accurate.    Chaitanya Amedee Camprubi-Soms, PA-C 01/09/16 1711    Daleen Bo, MD 01/10/16 [phone]

## 2016-01-09 NOTE — ED Notes (Signed)
Ortho called 

## 2016-01-09 NOTE — Discharge Instructions (Signed)
Wear knee sleeve for at least 2 weeks for stabilization of knee. Use crutches as needed for comfort. Ice and elevate knee throughout the day, using ice pack for no more than 20 minutes every hour. Alternate between tylenol and motrin as needed for pain relief. Call orthopedic follow up today or tomorrow to schedule followup appointment for recheck of ongoing knee pain in 1-2 weeks that can be canceled with a 24-48 hour notice if complete resolution of pain. Return to the ER for changes or worsening symptoms.

## 2016-01-27 NOTE — ED Triage Notes (Signed)
Opened chart to print return to work note at pts request

## 2016-02-14 ENCOUNTER — Emergency Department (HOSPITAL_COMMUNITY)

## 2016-02-14 ENCOUNTER — Emergency Department (HOSPITAL_COMMUNITY)
Admission: EM | Admit: 2016-02-14 | Discharge: 2016-02-14 | Disposition: A | Discharge status: Home / Self Care | Attending: Emergency Medicine | Admitting: Emergency Medicine

## 2016-02-14 ENCOUNTER — Encounter (HOSPITAL_COMMUNITY): Payer: Self-pay | Admitting: Emergency Medicine

## 2016-02-14 DIAGNOSIS — Y929 Unspecified place or not applicable: Secondary | ICD-10-CM | POA: Insufficient documentation

## 2016-02-14 DIAGNOSIS — W228XXA Striking against or struck by other objects, initial encounter: Secondary | ICD-10-CM | POA: Insufficient documentation

## 2016-02-14 DIAGNOSIS — Y999 Unspecified external cause status: Secondary | ICD-10-CM | POA: Insufficient documentation

## 2016-02-14 DIAGNOSIS — Y939 Activity, unspecified: Secondary | ICD-10-CM | POA: Insufficient documentation

## 2016-02-14 DIAGNOSIS — M25561 Pain in right knee: Secondary | ICD-10-CM | POA: Insufficient documentation

## 2016-02-14 DIAGNOSIS — Z79899 Other long term (current) drug therapy: Secondary | ICD-10-CM | POA: Insufficient documentation

## 2016-02-14 DIAGNOSIS — S60051A Contusion of right little finger without damage to nail, initial encounter: Secondary | ICD-10-CM

## 2016-02-14 MED ORDER — MELOXICAM 15 MG PO TABS: 15.0000 mg | ORAL_TABLET | Freq: Every day | ORAL | 0 refills | Status: DC

## 2016-02-14 NOTE — Discharge Instructions (Signed)
Ice and elevate hand and knee. Mobic for pain and inflammation as prescribed. See knee exercises below. Follow up with orthopedics.

## 2016-02-14 NOTE — ED Provider Notes (Signed)
[facility] DEPT Provider Note   CSN: [id number] Arrival date & time: 02/14/16  1154  By signing my name below, I, [name], attest that this documentation has been prepared under the direction and in the presence of Jaslyne Beeck, PA-C. Electronically Signed: Judithann Sauger, ED Scribe. 02/14/16. 12:27 PM.    History   Chief Complaint Chief Complaint  Patient presents with  . Hand Pain    struck door with fist 6 days ago  . Knee Pain    chronic r/knee pain    HPI Comments: Bradley Larson is a 20 y.o. male who presents to the Emergency Department complaining of gradually worsening moderate right hand pain s/p punching wall 6 days ago. He explains that he punched the wall with a closed fist. He also c/o acute onset of his chronic right knee pain onset several days ago. Pt was seen for his knee pain on 01/09/16 s/p prior right meniscal injury but denies that he has followed up with Ortho. He had a negative x-ray at that time and discharged with Crutches. No alleviating factors noted. Pt has not tried any medications PTA. He denies any fever, chills, generalized rash, or any open wounds.   The history is provided by the patient. No language interpreter was used.    Past Medical History:  Diagnosis Date  . Meniscus tear   . Migraines     There are no active problems to display for this patient.   Past Surgical History:  Procedure Laterality Date  . FRACTURE SURGERY Right    thumb       Home Medications    Prior to Admission medications   Medication Sig Start Date End Date Taking? Authorizing Provider  Alum & Mag Hydroxide-Simeth (MAGIC MOUTHWASH W/LIDOCAINE) SOLN Take 5 mLs by mouth 4 (four) times daily as needed for mouth pain. Patient not taking: Reported on 11/01/2014 09/30/14   Larene Pickett, PA-C  amoxicillin (AMOXIL) 500 MG capsule Take 1 capsule (500 mg total) by mouth 3 (three) times daily. Patient not taking: Reported on 11/01/2014 06/16/14    Lajean Saver, MD  cyclobenzaprine (FLEXERIL) 10 MG tablet Take 0.5-1 tablets (5-10 mg total) by mouth 2 (two) times daily as needed for muscle spasms. 10/04/15   Delos Haring, PA-C  HYDROcodone-acetaminophen (NORCO/VICODIN) 5-325 MG per tablet Take 1 tablet by mouth every 6 (six) hours as needed for moderate pain. Patient not taking: Reported on 06/18/2014 04/08/14   Evelina Bucy, MD  ibuprofen (ADVIL,MOTRIN) 600 MG tablet Take 1 tablet (600 mg total) by mouth every 6 (six) hours as needed. 09/05/15   Junius Creamer, NP  meloxicam (MOBIC) 15 MG tablet Take 1 tablet (15 mg total) by mouth daily. 02/14/16   Carlyle Mcelrath, PA-C  miconazole (MICOTIN) 2 % cream Apply 1 application topically 2 (two) times daily. 12/21/15   April Palumbo, MD  naproxen (NAPROSYN) 500 MG tablet Take 1 tablet (500 mg total) by mouth 2 (two) times daily with a meal. Patient not taking: Reported on 11/01/2014 06/18/14   Ernestina Patches, MD  naproxen (NAPROSYN) 500 MG tablet Take 1 tablet (500 mg total) by mouth 2 (two) times daily. 10/04/15   Tiffany Carlota Raspberry, PA-C  phenol (CHLORASEPTIC) 1.4 % LIQD Use as directed 1 spray in the mouth or throat as needed for throat irritation / pain. Patient not taking: Reported on 11/01/2014 08/02/14   Noland Fordyce, PA-C  polyethylene glycol powder (GLYCOLAX/MIRALAX) powder Take 17 g by mouth 2 (two) times daily. Until daily soft  stools  OTC Patient not taking: Reported on 10/04/2015 07/01/15   Montine Circle, PA-C  sodium chloride (OCEAN) 0.65 % SOLN nasal spray Place 2 sprays into both nostrils as needed for congestion. Patient not taking: Reported on 10/04/2015 08/02/14   Noland Fordyce, PA-C    Family History Family History  Problem Relation Age of Onset  . Asthma Mother   . Diabetes Other   . Hypertension Other     Social History Social History  Substance Use Topics  . Smoking status: Never Smoker  . Smokeless tobacco: Never Used  . Alcohol use Yes     Allergies   Review of  patient's allergies indicates no known allergies.   Review of Systems Review of Systems  Constitutional: Negative for chills and fever.  Musculoskeletal: Positive for arthralgias.  Skin: Negative for rash and wound.     Physical Exam Updated Vital Signs BP 137/92 (BP Location: Left Arm)   Pulse 94   Temp 98.7 F (37.1 C) (Oral)   Resp 15   Ht 6\' 4"  (1.93 m)   Wt 132.5 kg   SpO2 96%   BMI 35.54 kg/m   Physical Exam  Constitutional: He is oriented to person, place, and time. He appears well-developed and well-nourished.  HENT:  Head: Normocephalic and atraumatic.  Cardiovascular: Normal rate.   Pulmonary/Chest: Effort normal.  Musculoskeletal:  Normal appearing right knee. Tender to palpation over medial joint. Full ROM of the knee joint. Pain with full flexion and extension. Negative anterior and posterior drawer signs. No laxity or pain with medial or lateral stress.  Normal appearing right hand. Tender dilatation over the fourth MCP joint. Full range of motion of the fingers. No ttp over rest of carpals or metacarpals or finger. Cap refill <2sec   Neurological: He is alert and oriented to person, place, and time.  Skin: Skin is warm and dry.  Psychiatric: He has a normal mood and affect.  Nursing note and vitals reviewed.    ED Treatments / Results  DIAGNOSTIC STUDIES: Oxygen Saturation is 96% on RA, adequate by my interpretation.    COORDINATION OF CARE: 12:27 PM- Pt advised of plan for treatment and pt agrees. Pt will receive x-ray for further evaluation.    Labs (all labs ordered are listed, but only abnormal results are displayed) Labs Reviewed - No data to display  EKG  EKG Interpretation None       Radiology Dg Hand Complete Right  Result Date: 02/14/2016 CLINICAL DATA:  The patient struck a door with his right fist 6 days ago. Pain and swelling have worsened in the past 24 hours. Initial encounter. EXAM: RIGHT HAND - COMPLETE 3+ VIEW COMPARISON:   None. FINDINGS: There is no evidence of fracture or dislocation. There is no evidence of arthropathy or other focal bone abnormality. Soft tissues are unremarkable. IMPRESSION: Negative exam. Electronically Signed   By: Inge Rise M.D.   On: 02/14/2016 12:53    Procedures Procedures (including critical care time)  Medications Ordered in ED Medications - No data to display   Initial Impression / Assessment and Plan / ED Course  Jeannett Senior, PA-C has reviewed the triage vital signs and the nursing notes.  Pertinent labs & imaging results that were available during my care of the patient were reviewed by me and considered in my medical decision making (see chart for details).  Clinical Course   Patient in emergency department with contusion to the right hand, after punching something 6 days  ago. X-rays are negative. Patient is also complaining of of ongoing knee pain. He has been seen here for the same in the past. Exams of both knee and hand unremarkable. Will start on low back for pain and inflammation. Patient has referral to orthopedic specialist from prior visit. We'll have him ice, elevate, no strenuous activity, follow-up.   Vitals:   02/14/16 1159  BP: 137/92  Pulse: 94  Resp: 15  Temp: 98.7 F (37.1 C)  TempSrc: Oral  SpO2: 96%  Weight: 132.5 kg  Height: 6\' 4"  (1.93 m)     Final Clinical Impressions(s) / ED Diagnoses   Final diagnoses:  Contusion of fifth finger of right hand, initial encounter  Right knee pain    New Prescriptions New Prescriptions   MELOXICAM (MOBIC) 15 MG TABLET    Take 1 tablet (15 mg total) by mouth daily.   I personally performed the services described in this documentation, which was scribed in my presence. The recorded information has been reviewed and is accurate.    Jeannett Senior, PA-C 02/14/16 [place], MD 02/16/16 [phone]

## 2016-02-14 NOTE — ED Triage Notes (Signed)
Pt struck a door with a closed fist, r/hand,  6 days ago. C/o persistent pain and swelling, increased over the last 24 hours

## 2016-02-16 ENCOUNTER — Emergency Department (HOSPITAL_COMMUNITY)
Admission: EM | Admit: 2016-02-16 | Discharge: 2016-02-17 | Disposition: A | Discharge status: Home / Self Care | Attending: Emergency Medicine | Admitting: Emergency Medicine

## 2016-02-16 ENCOUNTER — Emergency Department (HOSPITAL_COMMUNITY)

## 2016-02-16 ENCOUNTER — Encounter (HOSPITAL_COMMUNITY): Payer: Self-pay | Admitting: *Deleted

## 2016-02-16 DIAGNOSIS — Z7951 Long term (current) use of inhaled steroids: Secondary | ICD-10-CM | POA: Insufficient documentation

## 2016-02-16 DIAGNOSIS — Z79899 Other long term (current) drug therapy: Secondary | ICD-10-CM | POA: Insufficient documentation

## 2016-02-16 DIAGNOSIS — R0789 Other chest pain: Secondary | ICD-10-CM | POA: Insufficient documentation

## 2016-02-16 LAB — BASIC METABOLIC PANEL
ANION GAP: 8 (ref 5–15)
BUN: 18 mg/dL (ref 6–20)
CALCIUM: 9.8 mg/dL (ref 8.9–10.3)
CO2: 27 mmol/L (ref 22–32)
Chloride: 104 mmol/L (ref 101–111)
Creatinine, Ser: 1.09 mg/dL (ref 0.61–1.24)
GFR calc Af Amer: >60 mL/min (ref >60)
GLUCOSE: 99 mg/dL (ref 65–99)
Potassium: 3.9 mmol/L (ref 3.5–5.1)
SODIUM: 139 mmol/L (ref 135–145)

## 2016-02-16 LAB — CBC
HCT: 49.3 % (ref 39.0–52.0)
HEMOGLOBIN: 17 g/dL (ref 13.0–17.0)
MCH: 30.5 pg (ref 26.0–34.0)
MCHC: 34.5 g/dL (ref 30.0–36.0)
MCV: 88.5 fL (ref 78.0–100.0)
Platelets: 262 10*3/uL (ref 150–400)
RBC: 5.57 MIL/uL (ref 4.22–5.81)
RDW: 12.7 % (ref 11.5–15.5)
WBC: 9.8 10*3/uL (ref 4.0–10.5)

## 2016-02-16 LAB — I-STAT TROPONIN, ED: TROPONIN I, POC: 0 ng/mL (ref 0.00–0.08)

## 2016-02-16 NOTE — ED Provider Notes (Signed)
[facility] DEPT Provider Note   CSN: [id number] Arrival date & time: 02/16/16  1806 By signing my name below, I, Bradley Larson, attest that this documentation has been prepared under the direction and in the presence of Bradley Etienne, DO . Electronically Signed: Dyke Larson, Scribe. 02/16/2016. 12:00 AM.   History   Chief Complaint Chief Complaint  Patient presents with  . Emesis  . Chest Pain    HPI Bradley Larson is a 20 y.o. male who presents to the Emergency Department complaining of waxing and waning moderate mid-chest pain onset yesterday. He describes pain as sharp and stabbing. Per pt, pain is exacerbated by deep breathing and lying down. Pt also complains of associated nausea and vomiting x2 days and lightheadedness when standing up.  He denies any recent trauma, exertion, travelling, testosterone usage, or hx of blood clots. Pt has no other complaints at this time.   The history is provided by the patient. No language interpreter was used.   Past Medical History:  Diagnosis Date  . Meniscus tear   . Migraines     There are no active problems to display for this patient.   Past Surgical History:  Procedure Laterality Date  . FRACTURE SURGERY Right    thumb     Home Medications    Prior to Admission medications   Medication Sig Start Date End Date Taking? Authorizing Provider  Alum & Mag Hydroxide-Simeth (MAGIC MOUTHWASH W/LIDOCAINE) SOLN Take 5 mLs by mouth 4 (four) times daily as needed for mouth pain. Patient not taking: Reported on 11/01/2014 09/30/14   Larene Pickett, PA-C  amoxicillin (AMOXIL) 500 MG capsule Take 1 capsule (500 mg total) by mouth 3 (three) times daily. Patient not taking: Reported on 11/01/2014 06/16/14   Lajean Saver, MD  cyclobenzaprine (FLEXERIL) 10 MG tablet Take 0.5-1 tablets (5-10 mg total) by mouth 2 (two) times daily as needed for muscle spasms. 10/04/15   Delos Haring, PA-C  HYDROcodone-acetaminophen (NORCO/VICODIN) 5-325 MG per  tablet Take 1 tablet by mouth every 6 (six) hours as needed for moderate pain. Patient not taking: Reported on 06/18/2014 04/08/14   Evelina Bucy, MD  ibuprofen (ADVIL,MOTRIN) 600 MG tablet Take 1 tablet (600 mg total) by mouth every 6 (six) hours as needed. 09/05/15   Junius Creamer, NP  meloxicam (MOBIC) 15 MG tablet Take 1 tablet (15 mg total) by mouth daily. 02/14/16   Tatyana Kirichenko, PA-C  miconazole (MICOTIN) 2 % cream Apply 1 application topically 2 (two) times daily. 12/21/15   April Palumbo, MD  naproxen (NAPROSYN) 500 MG tablet Take 1 tablet (500 mg total) by mouth 2 (two) times daily with a meal. Patient not taking: Reported on 11/01/2014 06/18/14   Ernestina Patches, MD  naproxen (NAPROSYN) 500 MG tablet Take 1 tablet (500 mg total) by mouth 2 (two) times daily. 10/04/15   Tiffany Carlota Raspberry, PA-C  phenol (CHLORASEPTIC) 1.4 % LIQD Use as directed 1 spray in the mouth or throat as needed for throat irritation / pain. Patient not taking: Reported on 11/01/2014 08/02/14   Noland Fordyce, PA-C  polyethylene glycol powder (GLYCOLAX/MIRALAX) powder Take 17 g by mouth 2 (two) times daily. Until daily soft stools  OTC Patient not taking: Reported on 10/04/2015 07/01/15   Montine Circle, PA-C  sodium chloride (OCEAN) 0.65 % SOLN nasal spray Place 2 sprays into both nostrils as needed for congestion. Patient not taking: Reported on 10/04/2015 08/02/14   Noland Fordyce, PA-C    Family History Family History  Problem Relation Age of Onset  . Asthma Mother   . Diabetes Other   . Hypertension Other     Social History Social History  Substance Use Topics  . Smoking status: Never Smoker  . Smokeless tobacco: Never Used  . Alcohol use Yes     Allergies   Review of patient's allergies indicates no known allergies.   Review of Systems Review of Systems  Constitutional: Negative for chills and fever.  HENT: Negative for congestion and facial swelling.   Eyes: Negative for discharge and visual  disturbance.  Respiratory: Negative for shortness of breath.   Cardiovascular: Positive for chest pain. Negative for palpitations.  Gastrointestinal: Positive for nausea and vomiting. Negative for abdominal pain and diarrhea.  Musculoskeletal: Negative for arthralgias and myalgias.  Skin: Negative for color change and rash.  Neurological: Positive for light-headedness. Negative for tremors, syncope and headaches.  Psychiatric/Behavioral: Negative for confusion and dysphoric mood.     Physical Exam Updated Vital Signs BP 132/76 (BP Location: Right Arm)   Pulse 79   Temp 99.3 F (37.4 C) (Oral)   Resp 24   SpO2 98%   Physical Exam  Constitutional: He is oriented to person, place, and time. He appears well-developed and well-nourished.  HENT:  Head: Normocephalic and atraumatic.  Eyes: EOM are normal. Pupils are equal, round, and reactive to light.  Neck: Normal range of motion. Neck supple. No JVD present.  Cardiovascular: Normal rate and regular rhythm.  Exam reveals no gallop and no friction rub.   No murmur heard. Pulmonary/Chest: No respiratory distress. He has no wheezes. He exhibits tenderness.  Pain is reproduced with palpitation to the chest wall  Abdominal: He exhibits no distension. There is no rebound and no guarding.  Musculoskeletal: Normal range of motion.  Neurological: He is alert and oriented to person, place, and time.  Skin: No rash noted. No pallor.  Psychiatric: He has a normal mood and affect. His behavior is normal.  Nursing note and vitals reviewed.   ED Treatments / Results  DIAGNOSTIC STUDIES:  Oxygen Saturation is 98% on RA, normal by my interpretation.    COORDINATION OF CARE:  11:54 PM Discussed treatment plan with pt at bedside and pt agreed to plan.   Labs (all labs ordered are listed, but only abnormal results are displayed) Labs Reviewed  [place], ED    EKG  EKG  Interpretation  Date/Time:  Thursday February 16 2016 19:14:10 EDT Ventricular Rate:  84 PR Interval:    QRS Duration: 85 QT Interval:  349 QTC Calculation: 413 R Axis:   88 Text Interpretation:  Sinus rhythm No significant change since last tracing Confirmed by Yuliya Nova MD, Quillian Quince [phone]) on 02/16/2016 11:37:09 PM       Radiology Dg Chest 2 View  Result Date: 02/16/2016 CLINICAL DATA:  Chest pain, lightheadedness, and nausea and vomiting for 2 days. EXAM: CHEST  2 VIEW COMPARISON:  11/01/2014 FINDINGS: The heart size and mediastinal contours are within normal limits. Both lungs are clear. No evidence of pneumothorax or pleural effusion. The visualized skeletal structures are unremarkable. IMPRESSION: No active cardiopulmonary disease. Electronically Signed   By: Earle Gell M.D.   On: 02/16/2016 19:53    Procedures Procedures (including critical care time)  Medications Ordered in ED Medications - No data to display   Initial Impression / Assessment and Plan / ED Course  I have reviewed the triage vital signs and the nursing notes.  Pertinent labs & imaging results that were available during my care of the patient were reviewed by me and considered in my medical decision making (see chart for details).  Clinical Course    20 yo M With a chief complaint of sharp chest pain. This is worse with palpation and movement. Been going on for the past couple days. Seems muscular skeletal by history and physical. EKG and chest x-ray are unremarkable. PERC negative. Discharge home.  12:15 AM:  I have discussed the diagnosis/risks/treatment options with the patient and family and believe the pt to be eligible for discharge home to follow-up with PCP. We also discussed returning to the ED immediately if new or worsening sx occur. We discussed the sx which are most concerning (e.g., sudden worsening pain, fever, inability to tolerate by mouth) that necessitate immediate return. Medications  administered to the patient during their visit and any new prescriptions provided to the patient are listed below.  Medications given during this visit Medications - No data to display   The patient appears reasonably screen and/or stabilized for discharge and I doubt any other medical condition or other [facility] requiring further screening, evaluation, or treatment in the ED at this time prior to discharge.    Final Clinical Impressions(s) / ED Diagnoses   Final diagnoses:  Atypical chest pain    I personally performed the services described in this documentation, which was scribed in my presence. The recorded information has been reviewed and is accurate.   New Prescriptions New Prescriptions   No medications on file     Bradley Etienne, DO 02/17/16 0015

## 2016-02-16 NOTE — ED Triage Notes (Signed)
Pt reports n/v 2 days ago and started to have chest pain yesterday.  Cp is worse when taking laying down.  Pt reports lightheadedness only when standing up.  None at present.  Pt reports pain in his mid chest.  Describes pain as sharp and stabbing which is worse when taking a deep breath.

## 2016-02-17 NOTE — Discharge Instructions (Signed)

## 2016-05-12 ENCOUNTER — Encounter (HOSPITAL_COMMUNITY): Payer: Self-pay | Admitting: Emergency Medicine

## 2016-05-12 ENCOUNTER — Emergency Department (HOSPITAL_COMMUNITY)
Admission: EM | Admit: 2016-05-12 | Discharge: 2016-05-12 | Disposition: A | Discharge status: Home / Self Care | Payer: Self-pay | Attending: Emergency Medicine | Admitting: Emergency Medicine

## 2016-05-12 DIAGNOSIS — R040 Epistaxis: Secondary | ICD-10-CM | POA: Insufficient documentation

## 2016-05-12 DIAGNOSIS — Z79899 Other long term (current) drug therapy: Secondary | ICD-10-CM | POA: Insufficient documentation

## 2016-05-12 DIAGNOSIS — L02411 Cutaneous abscess of right axilla: Secondary | ICD-10-CM | POA: Insufficient documentation

## 2016-05-12 DIAGNOSIS — L0291 Cutaneous abscess, unspecified: Secondary | ICD-10-CM

## 2016-05-12 MED ORDER — SULFAMETHOXAZOLE-TRIMETHOPRIM 800-160 MG PO TABS: 1.0000 | ORAL_TABLET | Freq: Two times a day (BID) | ORAL | 0 refills | Status: AC

## 2016-05-12 MED ORDER — LIDOCAINE-EPINEPHRINE (PF) 2 %-1:200000 IJ SOLN
20.0000 mL | Freq: Once | INTRAMUSCULAR | Status: AC
  Administered 2016-05-12: 20 mL
  Filled 2016-05-12: qty 20

## 2016-05-12 MED ORDER — SULFAMETHOXAZOLE-TRIMETHOPRIM 800-160 MG PO TABS
1.0000 | ORAL_TABLET | Freq: Once | ORAL | Status: AC
  Administered 2016-05-12: 1 via ORAL
  Filled 2016-05-12: qty 1

## 2016-05-12 MED ORDER — SALINE SPRAY 0.65 % NA SOLN
1.0000 | Freq: Once | NASAL | Status: AC
  Administered 2016-05-12: 1 via NASAL
  Filled 2016-05-12: qty 44

## 2016-05-12 NOTE — ED Triage Notes (Signed)
Pt states he has an abscess under his right arm that has been there for 3 -4 weeks  Pt states it popped on Thanksgiving but came back

## 2016-05-12 NOTE — ED Provider Notes (Signed)
[facility] DEPT Provider Note   CSN: [id number] Arrival date & time: 05/12/16  [id number]     History   Chief Complaint Chief Complaint  Patient presents with  . Abscess    HPI Bradley Larson is a 20 y.o. male.  Pt presents to the ED today with an abscess under his right axilla.  He said it has been there for 3-4 weeks.  He also sneezed this morning and it had some blood in it.  He was worried about that as well.  No fevers or chills.      Past Medical History:  Diagnosis Date  . Meniscus tear   . Migraines     There are no active problems to display for this patient.   Past Surgical History:  Procedure Laterality Date  . FRACTURE SURGERY Right    thumb       Home Medications    Prior to Admission medications   Medication Sig Start Date End Date Taking? Authorizing Provider  Alum & Mag Hydroxide-Simeth (MAGIC MOUTHWASH W/LIDOCAINE) SOLN Take 5 mLs by mouth 4 (four) times daily as needed for mouth pain. Patient not taking: Reported on 11/01/2014 09/30/14   Larene Pickett, PA-C  amoxicillin (AMOXIL) 500 MG capsule Take 1 capsule (500 mg total) by mouth 3 (three) times daily. Patient not taking: Reported on 11/01/2014 06/16/14   Lajean Saver, MD  cyclobenzaprine (FLEXERIL) 10 MG tablet Take 0.5-1 tablets (5-10 mg total) by mouth 2 (two) times daily as needed for muscle spasms. 10/04/15   Delos Haring, PA-C  HYDROcodone-acetaminophen (NORCO/VICODIN) 5-325 MG per tablet Take 1 tablet by mouth every 6 (six) hours as needed for moderate pain. Patient not taking: Reported on 06/18/2014 04/08/14   Evelina Bucy, MD  ibuprofen (ADVIL,MOTRIN) 600 MG tablet Take 1 tablet (600 mg total) by mouth every 6 (six) hours as needed. 09/05/15   Junius Creamer, NP  meloxicam (MOBIC) 15 MG tablet Take 1 tablet (15 mg total) by mouth daily. 02/14/16   Tatyana Kirichenko, PA-C  miconazole (MICOTIN) 2 % cream Apply 1 application topically 2 (two) times daily. 12/21/15   April Palumbo, MD  naproxen  (NAPROSYN) 500 MG tablet Take 1 tablet (500 mg total) by mouth 2 (two) times daily with a meal. Patient not taking: Reported on 11/01/2014 06/18/14   Ernestina Patches, MD  naproxen (NAPROSYN) 500 MG tablet Take 1 tablet (500 mg total) by mouth 2 (two) times daily. 10/04/15   Tiffany Carlota Raspberry, PA-C  phenol (CHLORASEPTIC) 1.4 % LIQD Use as directed 1 spray in the mouth or throat as needed for throat irritation / pain. Patient not taking: Reported on 11/01/2014 08/02/14   Noland Fordyce, PA-C  polyethylene glycol powder (GLYCOLAX/MIRALAX) powder Take 17 g by mouth 2 (two) times daily. Until daily soft stools  OTC Patient not taking: Reported on 10/04/2015 07/01/15   Montine Circle, PA-C  sodium chloride (OCEAN) 0.65 % SOLN nasal spray Place 2 sprays into both nostrils as needed for congestion. Patient not taking: Reported on 10/04/2015 08/02/14   Noland Fordyce, PA-C  sulfamethoxazole-trimethoprim (BACTRIM DS,SEPTRA DS) 800-160 MG tablet Take 1 tablet by mouth 2 (two) times daily. 05/12/16 05/19/16  Isla Pence, MD    Family History Family History  Problem Relation Age of Onset  . Asthma Mother   . Diabetes Other   . Hypertension Other   . CAD Other     Social History Social History  Substance Use Topics  . Smoking status: Never Smoker  . Smokeless  tobacco: Never Used  . Alcohol use Yes     Allergies   Patient has no known allergies.   Review of Systems Review of Systems  HENT: Positive for congestion.   Skin: Positive for wound.  All other systems reviewed and are negative.    Physical Exam Updated Vital Signs BP 146/90 (BP Location: Left Arm)   Pulse 87   Temp 98 F (36.7 C) (Oral)   Resp 16   Ht 6\' 4"  (1.93 m)   Wt 290 lb (131.5 kg)   SpO2 97%   BMI 35.30 kg/m   Physical Exam  Constitutional: He is oriented to person, place, and time. He appears well-developed and well-nourished.  HENT:  Head: Normocephalic and atraumatic.  Right Ear: External ear normal.  Left Ear:  External ear normal.  Nose: Mucosal edema present.  Mouth/Throat: Oropharynx is clear and moist.  Eyes: Conjunctivae and EOM are normal. Pupils are equal, round, and reactive to light.  Neck: Normal range of motion. Neck supple.  Cardiovascular: Normal rate, regular rhythm, normal heart sounds and intact distal pulses.   Pulmonary/Chest: Effort normal and breath sounds normal.  Abdominal: Soft. Bowel sounds are normal.  Musculoskeletal: Normal range of motion.  Neurological: He is alert and oriented to person, place, and time.  Skin: Skin is warm.  2 small abscesses to right axilla  Psychiatric: He has a normal mood and affect. His behavior is normal. Judgment and thought content normal.  Nursing note and vitals reviewed.    ED Treatments / Results  Labs (all labs ordered are listed, but only abnormal results are displayed) Labs Reviewed - No data to display  EKG  EKG Interpretation None       Radiology No results found.  Procedures .Marland KitchenIncision and Drainage Date/Time: 05/12/2016 7:48 AM Performed by: Isla Pence Authorized by: Isla Pence   Consent:    Consent obtained:  Verbal   Consent given by:  Patient   Risks discussed:  Bleeding, incomplete drainage, pain and infection   Alternatives discussed:  No treatment Location:    Type:  Abscess   Size:  1 cm   Location:  Upper extremity   Upper extremity location:  Arm   Arm location:  R upper arm Pre-procedure details:    Skin preparation:  Chloraprep Anesthesia (see MAR for exact dosages):    Anesthesia method:  Local infiltration   Local anesthetic:  Lidocaine 2% WITH epi Procedure type:    Complexity:  Simple Procedure details:    Incision types:  Cruciate   Incision depth:  Dermal   Scalpel blade:  11   Drainage:  Purulent   Drainage amount:  Moderate   Wound treatment:  Wound left open   Packing materials:  None Post-procedure details:    Patient tolerance of procedure:  Tolerated well, no  immediate complications   (including critical care time)  Medications Ordered in ED Medications  sulfamethoxazole-trimethoprim (BACTRIM DS,SEPTRA DS) 800-160 MG per tablet 1 tablet (not administered)  lidocaine-EPINEPHrine (XYLOCAINE W/EPI) 2 %-1:200000 (PF) injection 20 mL (20 mLs Infiltration Given by Other 05/12/16 0733)  sodium chloride (OCEAN) 0.65 % nasal spray 1 spray (1 spray Each Nare Given 05/12/16 0744)     Initial Impression / Assessment and Plan / ED Course  I have reviewed the triage vital signs and the nursing notes.  Pertinent labs & imaging results that were available during my care of the patient were reviewed by me and considered in my medical decision making (see  chart for details).  Clinical Course    Pt will be given saline spray here for his nose.  He will be given his first dose of bactrim here as well.  He knows to return if worse.  Final Clinical Impressions(s) / ED Diagnoses   Final diagnoses:  Abscess  Acute anterior epistaxis    New Prescriptions New Prescriptions   SULFAMETHOXAZOLE-TRIMETHOPRIM (BACTRIM DS,SEPTRA DS) 800-160 MG TABLET    Take 1 tablet by mouth 2 (two) times daily.     Isla Pence, MD 05/12/16 [phone]

## 2016-06-12 ENCOUNTER — Emergency Department (HOSPITAL_COMMUNITY)
Admission: EM | Admit: 2016-06-12 | Discharge: 2016-06-12 | Disposition: A | Discharge status: Home / Self Care | Payer: Self-pay | Attending: Emergency Medicine | Admitting: Emergency Medicine

## 2016-06-12 ENCOUNTER — Encounter (HOSPITAL_COMMUNITY): Payer: Self-pay | Admitting: Emergency Medicine

## 2016-06-12 DIAGNOSIS — J069 Acute upper respiratory infection, unspecified: Secondary | ICD-10-CM

## 2016-06-12 DIAGNOSIS — B9789 Other viral agents as the cause of diseases classified elsewhere: Secondary | ICD-10-CM

## 2016-06-12 LAB — RAPID STREP SCREEN (MED CTR MEBANE ONLY): Streptococcus, Group A Screen (Direct): NEGATIVE

## 2016-06-12 MED ORDER — BENZONATATE 100 MG PO CAPS: 100.0000 mg | ORAL_CAPSULE | Freq: Three times a day (TID) | ORAL | 0 refills | Status: DC

## 2016-06-12 MED ORDER — PHENOL 1.4 % MT LIQD: 1.0000 | OROMUCOSAL | 0 refills | Status: DC | PRN

## 2016-06-12 NOTE — ED Provider Notes (Signed)
[facility] DEPT Provider Note    By signing my name below, I, Bradley Larson, attest that this documentation has been prepared under the direction and in the presence of Bradley Carnes, PA-C. Electronically Signed: Bea Larson, ED Scribe. 06/12/16. 10:47 AM.    History   Chief Complaint Chief Complaint  Patient presents with  . Cough    The history is provided by the patient and medical records. No language interpreter was used.    HPI Comments:  Bradley Larson is an obese 21 y.o. male who presents to the Emergency Department complaining of a dry cough that began 2-3 days ago. He reports associated generalized body aches, sore throat and subjective fever onset last night. He has not taken anything for his symptoms. Swallowing increases his sore throat. He denies alleviating factors. He denies nausea, vomiting.    Past Medical History:  Diagnosis Date  . Meniscus tear   . Migraines     There are no active problems to display for this patient.   Past Surgical History:  Procedure Laterality Date  . FRACTURE SURGERY Right    thumb       Home Medications    Prior to Admission medications   Medication Sig Start Date End Date Taking? Authorizing Provider  Alum & Mag Hydroxide-Simeth (MAGIC MOUTHWASH W/LIDOCAINE) SOLN Take 5 mLs by mouth 4 (four) times daily as needed for mouth pain. Patient not taking: Reported on 11/01/2014 09/30/14   Larene Pickett, PA-C  amoxicillin (AMOXIL) 500 MG capsule Take 1 capsule (500 mg total) by mouth 3 (three) times daily. Patient not taking: Reported on 11/01/2014 06/16/14   Lajean Saver, MD  cyclobenzaprine (FLEXERIL) 10 MG tablet Take 0.5-1 tablets (5-10 mg total) by mouth 2 (two) times daily as needed for muscle spasms. 10/04/15   Delos Haring, PA-C  HYDROcodone-acetaminophen (NORCO/VICODIN) 5-325 MG per tablet Take 1 tablet by mouth every 6 (six) hours as needed for moderate pain. Patient not taking: Reported on 06/18/2014 04/08/14    Evelina Bucy, MD  ibuprofen (ADVIL,MOTRIN) 600 MG tablet Take 1 tablet (600 mg total) by mouth every 6 (six) hours as needed. 09/05/15   Junius Creamer, NP  meloxicam (MOBIC) 15 MG tablet Take 1 tablet (15 mg total) by mouth daily. 02/14/16   Tatyana Kirichenko, PA-C  miconazole (MICOTIN) 2 % cream Apply 1 application topically 2 (two) times daily. 12/21/15   April Palumbo, MD  naproxen (NAPROSYN) 500 MG tablet Take 1 tablet (500 mg total) by mouth 2 (two) times daily with a meal. Patient not taking: Reported on 11/01/2014 06/18/14   Ernestina Patches, MD  naproxen (NAPROSYN) 500 MG tablet Take 1 tablet (500 mg total) by mouth 2 (two) times daily. 10/04/15   Tiffany Carlota Raspberry, PA-C  phenol (CHLORASEPTIC) 1.4 % LIQD Use as directed 1 spray in the mouth or throat as needed for throat irritation / pain. Patient not taking: Reported on 11/01/2014 08/02/14   Noland Fordyce, PA-C  polyethylene glycol powder (GLYCOLAX/MIRALAX) powder Take 17 g by mouth 2 (two) times daily. Until daily soft stools  OTC Patient not taking: Reported on 10/04/2015 07/01/15   Montine Circle, PA-C  sodium chloride (OCEAN) 0.65 % SOLN nasal spray Place 2 sprays into both nostrils as needed for congestion. Patient not taking: Reported on 10/04/2015 08/02/14   Noland Fordyce, PA-C    Family History Family History  Problem Relation Age of Onset  . Asthma Mother   . Diabetes Other   . Hypertension Other   .  CAD Other     Social History Social History  Substance Use Topics  . Smoking status: Never Smoker  . Smokeless tobacco: Never Used  . Alcohol use Yes     Allergies   Patient has no known allergies.   Review of Systems Review of Systems  Constitutional: Positive for fever.  HENT: Positive for sore throat.   Respiratory: Positive for cough.   Gastrointestinal: Negative for nausea and vomiting.  All other systems reviewed and are negative.    Physical Exam Updated Vital Signs BP 135/80 (BP Location: Left Arm)   Pulse 108    Temp 100.8 F (38.2 C) (Oral)   Resp 20   SpO2 95%   Physical Exam  Constitutional: He is oriented to person, place, and time. He appears well-developed and well-nourished.  HENT:  Head: Normocephalic and atraumatic.  Right Ear: Tympanic membrane normal.  Left Ear: Tympanic membrane normal.  Nose: Nose normal.  Mouth/Throat: Uvula is midline and mucous membranes are normal. Posterior oropharyngeal erythema present.  Tonsils 2+ bilaterally without exudate; uvula midline without evidence of peritonsillar abscess; handling secretions appropriately; no difficulty swallowing or speaking; normal phonation without stridor.  Eyes: Conjunctivae and EOM are normal. Pupils are equal, round, and reactive to light.  Neck: Normal range of motion.  Cardiovascular: Normal rate, regular rhythm and normal heart sounds.   Pulmonary/Chest: Effort normal and breath sounds normal. No respiratory distress. He has no wheezes.  Abdominal: Soft. Bowel sounds are normal.  Musculoskeletal: Normal range of motion.  Neurological: He is alert and oriented to person, place, and time.  Skin: Skin is warm and dry.  Psychiatric: He has a normal mood and affect.  Nursing note and vitals reviewed.    ED Treatments / Results  DIAGNOSTIC STUDIES: Oxygen Saturation is 95% on RA, adequate by my interpretation.   COORDINATION OF CARE: 10:44 AM- Will order rapid strep test. Pt verbalizes understanding and agrees to plan.  Medications - No data to display  Labs (all labs ordered are listed, but only abnormal results are displayed) Labs Reviewed  RAPID STREP SCREEN (NOT AT [facility])  CULTURE, GROUP A STREP [facility])    EKG  EKG Interpretation None       Radiology No results found.  Procedures Procedures (including critical care time)  Medications Ordered in ED Medications - No data to display   Initial Impression / Assessment and Plan / ED Course  I have reviewed the triage vital signs and the nursing  notes.  Pertinent labs & imaging results that were available during my care of the patient were reviewed by me and considered in my medical decision making (see chart for details).  Clinical Course    21 y.o. M here with URI type symptoms x2-3 days.  He has low grade fever here, non-toxic in appearance.  Posterior oropharynx is slightly erythematous and tonsils are enlarged without exudates. He is handling secretions well. Lungs are clear without wheezes or rhonchi. Rapid strep was sent and is negative, culture pending. Suspect viral process. We'll plan to discharge home with symptomatic care. Follow-up with PCP.  Discussed plan with patient, he acknowledged understanding and agreed with plan of care.  Return precautions given for new or worsening symptoms.    I personally performed the services described in this documentation, which was scribed in my presence. The recorded information has been reviewed and is accurate.  Final Clinical Impressions(s) / ED Diagnoses   Final diagnoses:  Viral URI with cough  New Prescriptions New Prescriptions   BENZONATATE (TESSALON) 100 MG CAPSULE    Take 1 capsule (100 mg total) by mouth every 8 (eight) hours.   PHENOL (CHLORASEPTIC) 1.4 % LIQD    Use as directed 1 spray in the mouth or throat as needed for throat irritation / pain.     Larene Pickett, PA-C 06/12/16 [place], MD 06/14/16 1357

## 2016-06-12 NOTE — ED Triage Notes (Signed)
Per pt, states cough, body aches, sore throat for a couple of days

## 2016-06-12 NOTE — Discharge Instructions (Signed)
Your strep test was negative.  Your symptoms are likely viral in nature. Recommend to rest, drink fluids. Take the medications I have written for you to help with symptoms. Follow-up with your primary care doctor. Return here for new concerns.

## 2016-06-14 LAB — CULTURE, GROUP A STREP (THRC)

## 2016-07-10 ENCOUNTER — Emergency Department (HOSPITAL_COMMUNITY): Payer: Self-pay

## 2016-07-10 ENCOUNTER — Emergency Department (HOSPITAL_COMMUNITY)
Admission: EM | Admit: 2016-07-10 | Discharge: 2016-07-11 | Disposition: A | Discharge status: Home / Self Care | Payer: Self-pay | Attending: Emergency Medicine | Admitting: Emergency Medicine

## 2016-07-10 ENCOUNTER — Encounter (HOSPITAL_COMMUNITY): Payer: Self-pay | Admitting: Family Medicine

## 2016-07-10 DIAGNOSIS — M545 Low back pain, unspecified: Secondary | ICD-10-CM

## 2016-07-10 DIAGNOSIS — Y999 Unspecified external cause status: Secondary | ICD-10-CM | POA: Insufficient documentation

## 2016-07-10 DIAGNOSIS — Y929 Unspecified place or not applicable: Secondary | ICD-10-CM | POA: Insufficient documentation

## 2016-07-10 DIAGNOSIS — X58XXXA Exposure to other specified factors, initial encounter: Secondary | ICD-10-CM | POA: Insufficient documentation

## 2016-07-10 DIAGNOSIS — Y939 Activity, unspecified: Secondary | ICD-10-CM | POA: Insufficient documentation

## 2016-07-10 MED ORDER — CYCLOBENZAPRINE HCL 10 MG PO TABS: 10.0000 mg | ORAL_TABLET | Freq: Every evening | ORAL | 0 refills | Status: DC | PRN

## 2016-07-10 MED ORDER — IBUPROFEN 600 MG PO TABS: 600.0000 mg | ORAL_TABLET | Freq: Four times a day (QID) | ORAL | 0 refills | Status: DC | PRN

## 2016-07-10 MED ORDER — HYDROCODONE-ACETAMINOPHEN 5-325 MG PO TABS
1.0000 | ORAL_TABLET | Freq: Once | ORAL | Status: AC
  Administered 2016-07-10: 1 via ORAL
  Filled 2016-07-10: qty 1

## 2016-07-10 NOTE — Discharge Instructions (Signed)
Take anti-inflammatory medicine (Ibuprofen) for the next week. Take this medicine with food. Take muscle relaxer at bedtime to help you sleep. This medicine makes you drowsy so do not take before driving or work Use a heating pad for sore muscles - use for 20 minutes several times a day

## 2016-07-10 NOTE — ED Provider Notes (Signed)
[facility] DEPT Provider Note   CSN: [id number] Arrival date & time: 07/10/16  2100   History   Chief Complaint Chief Complaint  Patient presents with  . Back Pain    HPI Bradley Larson is a 21 y.o. male who presents with low back pain. No significant past medical history. He states that the pain started acutely around noon today while he was lying in bed. It is a sharp pain and constant. It's worse with movement and walking. No fever, syncope, trauma, unexplained weight loss, hx of cancer, loss of bowel/bladder function, saddle anesthesia, urinary retention, IVDU, leg weakness. He states he works at [employer] but does not do any heavy lifting.  HPI  Past Medical History:  Diagnosis Date  . Meniscus tear   . Migraines     There are no active problems to display for this patient.   Past Surgical History:  Procedure Laterality Date  . FRACTURE SURGERY Right    thumb       Home Medications    Prior to Admission medications   Medication Sig Start Date End Date Taking? Authorizing Provider  Alum & Mag Hydroxide-Simeth (MAGIC MOUTHWASH W/LIDOCAINE) SOLN Take 5 mLs by mouth 4 (four) times daily as needed for mouth pain. Patient not taking: Reported on 11/01/2014 09/30/14   Larene Pickett, PA-C  amoxicillin (AMOXIL) 500 MG capsule Take 1 capsule (500 mg total) by mouth 3 (three) times daily. Patient not taking: Reported on 11/01/2014 06/16/14   Lajean Saver, MD  benzonatate (TESSALON) 100 MG capsule Take 1 capsule (100 mg total) by mouth every 8 (eight) hours. 06/12/16   Larene Pickett, PA-C  cyclobenzaprine (FLEXERIL) 10 MG tablet Take 0.5-1 tablets (5-10 mg total) by mouth 2 (two) times daily as needed for muscle spasms. 10/04/15   Delos Haring, PA-C  HYDROcodone-acetaminophen (NORCO/VICODIN) 5-325 MG per tablet Take 1 tablet by mouth every 6 (six) hours as needed for moderate pain. Patient not taking: Reported on 06/18/2014 04/08/14   Evelina Bucy, MD  ibuprofen (ADVIL,MOTRIN) 600  MG tablet Take 1 tablet (600 mg total) by mouth every 6 (six) hours as needed. 09/05/15   Junius Creamer, NP  meloxicam (MOBIC) 15 MG tablet Take 1 tablet (15 mg total) by mouth daily. 02/14/16   Tatyana Kirichenko, PA-C  miconazole (MICOTIN) 2 % cream Apply 1 application topically 2 (two) times daily. 12/21/15   April Palumbo, MD  naproxen (NAPROSYN) 500 MG tablet Take 1 tablet (500 mg total) by mouth 2 (two) times daily with a meal. Patient not taking: Reported on 11/01/2014 06/18/14   Ernestina Patches, MD  naproxen (NAPROSYN) 500 MG tablet Take 1 tablet (500 mg total) by mouth 2 (two) times daily. 10/04/15   Tiffany Carlota Raspberry, PA-C  phenol (CHLORASEPTIC) 1.4 % LIQD Use as directed 1 spray in the mouth or throat as needed for throat irritation / pain. 06/12/16   Larene Pickett, PA-C  polyethylene glycol powder (GLYCOLAX/MIRALAX) powder Take 17 g by mouth 2 (two) times daily. Until daily soft stools  OTC Patient not taking: Reported on 10/04/2015 07/01/15   Montine Circle, PA-C  sodium chloride (OCEAN) 0.65 % SOLN nasal spray Place 2 sprays into both nostrils as needed for congestion. Patient not taking: Reported on 10/04/2015 08/02/14   Noland Fordyce, PA-C    Family History Family History  Problem Relation Age of Onset  . Asthma Mother   . Diabetes Other   . Hypertension Other   . CAD Other  Social History Social History  Substance Use Topics  . Smoking status: Never Smoker  . Smokeless tobacco: Never Used  . Alcohol use Yes     Comment: Twice a month     Allergies   Patient has no known allergies.   Review of Systems Review of Systems  Constitutional: Negative for fever.  Musculoskeletal: Positive for back pain. Negative for gait problem.  Neurological: Negative for syncope, weakness and numbness.     Physical Exam Updated Vital Signs BP 117/65 (BP Location: Left Arm)   Pulse 73   Temp 97.7 F (36.5 C) (Oral)   Resp 18   Ht 6\' 3"  (1.905 m)   Wt (!) 137 kg   SpO2 99%   BMI  37.75 kg/m   Physical Exam  Constitutional: He is oriented to person, place, and time. He appears well-developed and well-nourished. No distress.  HENT:  Head: Normocephalic and atraumatic.  Eyes: Conjunctivae are normal. Pupils are equal, round, and reactive to light. Right eye exhibits no discharge. Left eye exhibits no discharge. No scleral icterus.  Neck: Normal range of motion.  Cardiovascular: Normal rate.   Pulmonary/Chest: Effort normal. No respiratory distress.  Abdominal: He exhibits no distension.  Musculoskeletal:  Inspection: No masses, deformity, or rash Palpation: Midline spinal tenderness from lower T spine to upper L spine. No paraspinal muscle tenderness. No CVA tenderness. Strength: 5/5 in lower extremities and normal plantar and dorsiflexion Sensation: Intact sensation with light touch in lower extremities bilaterally Reflexes: Patellar reflex is 2+ bilaterally SLR: Negative seated straight leg raise Gait: Normal gait  Neurological: He is alert and oriented to person, place, and time.  Skin: Skin is warm and dry.  Psychiatric: He has a normal mood and affect. His behavior is normal.  Nursing note and vitals reviewed.    ED Treatments / Results  Labs (all labs ordered are listed, but only abnormal results are displayed) Labs Reviewed - No data to display  EKG  EKG Interpretation None       Radiology Dg Lumbar Spine Complete  Result Date: 07/10/2016 CLINICAL DATA:  Acute onset of lower back pain, radiating to both hips. Initial encounter. EXAM: LUMBAR SPINE - COMPLETE 4+ VIEW COMPARISON:  None. FINDINGS: There is no evidence of fracture or subluxation. Vertebral bodies demonstrate normal height and alignment. Intervertebral disc spaces are preserved. The visualized neural foramina are grossly unremarkable in appearance. The visualized bowel gas pattern is unremarkable in appearance; air and stool are noted within the colon. The sacroiliac joints are within  normal limits. IMPRESSION: No evidence of fracture or subluxation along the lumbar spine. Electronically Signed   By: Garald Balding M.D.   On: 07/10/2016 23:04    Procedures Procedures (including critical care time)  Medications Ordered in ED Medications  HYDROcodone-acetaminophen (NORCO/VICODIN) 5-325 MG per tablet 1 tablet (1 tablet Oral Given 07/10/16 2251)     Initial Impression / Assessment and Plan / ED Course  I have reviewed the triage vital signs and the nursing notes.  Pertinent labs & imaging results that were available during my care of the patient were reviewed by me and considered in my medical decision making (see chart for details).  21 year old male presents with low back pain most likely MSK in nature. Vitals are normal. Xray negative. Pain treated in ED. Back exam is not concerning. Will d/c with NSAIDs and muscle relaxers. Ortho f/u given if symptoms don't improve. Patient is NAD, non-toxic, with stable VS. Patient is informed  of clinical course, understands medical decision making process, and agrees with plan. Opportunity for questions provided and all questions answered. Return precautions given.   Final Clinical Impressions(s) / ED Diagnoses   Final diagnoses:  Acute midline low back pain without sciatica    New Prescriptions Discharge Medication List as of 07/10/2016 11:51 PM       Recardo Evangelist, PA-C 07/11/16 [place], MD 07/11/16 2041

## 2016-07-10 NOTE — ED Triage Notes (Signed)
Pt is complaining of lower back pain that started around noon today. Denies any recent injury, urinary symptoms, or numbness/tingling. Pt reports he took IBUPROFEN 600mg  with no relief.

## 2017-03-07 ENCOUNTER — Emergency Department (HOSPITAL_COMMUNITY)
Admission: EM | Admit: 2017-03-07 | Discharge: 2017-03-07 | Disposition: A | Discharge status: Home / Self Care | Payer: Self-pay | Attending: Emergency Medicine | Admitting: Emergency Medicine

## 2017-03-07 ENCOUNTER — Emergency Department (HOSPITAL_COMMUNITY): Payer: Self-pay

## 2017-03-07 ENCOUNTER — Encounter (HOSPITAL_COMMUNITY): Payer: Self-pay | Admitting: Emergency Medicine

## 2017-03-07 DIAGNOSIS — M25562 Pain in left knee: Secondary | ICD-10-CM | POA: Insufficient documentation

## 2017-03-07 DIAGNOSIS — S8990XA Unspecified injury of unspecified lower leg, initial encounter: Secondary | ICD-10-CM

## 2017-03-07 MED ORDER — IBUPROFEN 200 MG PO TABS
600.0000 mg | ORAL_TABLET | Freq: Once | ORAL | Status: AC
  Administered 2017-03-07: 600 mg via ORAL
  Filled 2017-03-07: qty 3

## 2017-03-07 MED ORDER — IBUPROFEN 600 MG PO TABS: 600.0000 mg | ORAL_TABLET | Freq: Four times a day (QID) | ORAL | 0 refills | Status: DC | PRN

## 2017-03-07 NOTE — Discharge Instructions (Signed)
It was my pleasure taking care of you today!   Ibuprofen as needed for pain. Use crutches as needed for comfort. Ice and elevate knee throughout the day.  Call the orthopedist listed for follow up appointment for recheck of ongoing knee pain in one to two weeks. That appointment can be canceled with a 24-48 hour notice if complete resolution of pain.  Follow up with the orthopedist listed if symptoms do not improve in one week.   Return to the ER for new or worsening symptoms, swelling, change in color of your lower extremity, or any additional concerns.

## 2017-03-07 NOTE — ED Provider Notes (Signed)
[facility] DEPT Provider Note   CSN: [id number] Arrival date & time: 03/07/17  1358     History   Chief Complaint Chief Complaint  Patient presents with  . Knee Pain    HPI ANTONIE BORJON is a 21 y.o. male.  HPI   Patient is a 21 year old male with a history of meniscus tear at the right knee presenting for acute knee pain after a mechanical fall. Patient reports that he went down on his left knee in a twisting motion yesterday and was weightbearing on it, and it is continuing to have pain particularly on medial and lateral aspects of the knee. Patient reports pain is a 10/10 in quality. Patient has not tried anything for the pain. Additionally, patient is reporting he is having some pain in the right knee from feeling that it is "giving out on him". Patient denies any loss of sensation in his left foot, swelling, or weakness of the left extremity. There was no preceding dizziness, lightheadedness or presyncopal episode to this event.  Past Medical History:  Diagnosis Date  . Meniscus tear   . Migraines     There are no active problems to display for this patient.   Past Surgical History:  Procedure Laterality Date  . FRACTURE SURGERY Right    thumb       Home Medications    Prior to Admission medications   Medication Sig Start Date End Date Taking? Authorizing Provider  benzonatate (TESSALON) 100 MG capsule Take 1 capsule (100 mg total) by mouth every 8 (eight) hours. Patient not taking: Reported on 03/07/2017 06/12/16   Larene Pickett, PA-C  cyclobenzaprine (FLEXERIL) 10 MG tablet Take 1 tablet (10 mg total) by mouth at bedtime and may repeat dose one time if needed. Patient not taking: Reported on 03/07/2017 07/10/16   Recardo Evangelist, PA-C  ibuprofen (ADVIL,MOTRIN) 600 MG tablet Take 1 tablet (600 mg total) by mouth every 6 (six) hours as needed. Patient not taking: Reported on 03/07/2017 07/10/16   Recardo Evangelist, PA-C  meloxicam (MOBIC) 15 MG tablet Take  1 tablet (15 mg total) by mouth daily. Patient not taking: Reported on 03/07/2017 02/14/16   Jeannett Senior, PA-C  miconazole (MICOTIN) 2 % cream Apply 1 application topically 2 (two) times daily. Patient not taking: Reported on 03/07/2017 12/21/15   Palumbo, April, MD  naproxen (NAPROSYN) 500 MG tablet Take 1 tablet (500 mg total) by mouth 2 (two) times daily. Patient not taking: Reported on 03/07/2017 10/04/15   Delos Haring, PA-C  phenol (CHLORASEPTIC) 1.4 % LIQD Use as directed 1 spray in the mouth or throat as needed for throat irritation / pain. Patient not taking: Reported on 03/07/2017 06/12/16   Larene Pickett, PA-C    Family History Family History  Problem Relation Age of Onset  . Asthma Mother   . Diabetes Other   . Hypertension Other   . CAD Other     Social History Social History  Substance Use Topics  . Smoking status: Never Smoker  . Smokeless tobacco: Never Used  . Alcohol use Yes     Comment: Twice a month     Allergies   Patient has no known allergies.   Review of Systems Review of Systems  Musculoskeletal: Positive for arthralgias. Negative for joint swelling and myalgias.  Skin: Negative for wound.  Neurological: Negative for dizziness, syncope and light-headedness.     Physical Exam Updated Vital Signs BP (!) 135/91 (BP Location: Right Arm)  Pulse 77   Temp 98.5 F (36.9 C) (Oral)   Resp 18   SpO2 100%   Physical Exam  Constitutional: He appears well-developed and well-nourished. No distress.  Sitting comfortably in bed.  HENT:  Head: Normocephalic and atraumatic.  Eyes: Conjunctivae are normal. Right eye exhibits no discharge. Left eye exhibits no discharge.  EOMs normal to gross examination.  Neck: Normal range of motion.  Cardiovascular: Normal rate and regular rhythm.   Intact, 2+ radial pulse. Intact, 2+ dorsalis pedis pulse bilaterally.  Pulmonary/Chest:  Normal respiratory effort. Patient converses comfortably. No audible  wheeze or stridor.  Abdominal: He exhibits no distension.  Musculoskeletal:       Left knee: He exhibits decreased range of motion. He exhibits no swelling, no effusion, no ecchymosis, no deformity, no erythema, no LCL laxity, normal patellar mobility, normal meniscus and no MCL laxity. Tenderness found. Medial joint line and lateral joint line tenderness noted.  Patient unable to fully flex left knee. Full extension of left knee. Full flexion and extension of right knee No catching or clicking noted with McMurray's exam.  Neurological: He is alert.  Cranial nerves intact to gross observation. Patient moves extremities with good coordination. Sensation to light touch intact in bilateral distal extremities.  Skin: Skin is warm and dry. He is not diaphoretic.  Psychiatric: He has a normal mood and affect. His behavior is normal. Judgment and thought content normal.  Nursing note and vitals reviewed.    ED Treatments / Results  Labs (all labs ordered are listed, but only abnormal results are displayed) Labs Reviewed - No data to display  EKG  EKG Interpretation None       Radiology Dg Knee Complete 4 Views Left  Result Date: 03/07/2017 CLINICAL DATA:  [place] last night with pain in the posterior and lateral knee EXAM: LEFT KNEE - COMPLETE 4+ VIEW COMPARISON:  Left knee films of 02/05/2011 FINDINGS: The left knee joint spaces are well preserved. No fracture is seen. No joint effusion is noted. The patella appears normally positioned. IMPRESSION: Negative. Electronically Signed   By: Ivar Drape M.D.   On: 03/07/2017 17:07    Procedures Procedures (including critical care time)  Medications Ordered in ED Medications  ibuprofen (ADVIL,MOTRIN) tablet 600 mg (600 mg Oral Given 03/07/17 1556)     Initial Impression / Assessment and Plan / ED Course  I have reviewed the triage vital signs and the nursing notes.  Pertinent labs & imaging results that were available during my care of  the patient were reviewed by me and considered in my medical decision making (see chart for details).  Clinical Course as of Mar 08 1723  Thu Mar 07, 2017  1723 Xray results noted.   [AM]    Clinical Course User Index [AM] Albesa Seen, PA-C    Final Clinical Impressions(s) / ED Diagnoses   Final diagnoses:  Knee injury   MDM  Patient is a 21 year old male with a history of meniscus tear at the right knee presenting for acute knee pain after a mechanical fall. No significant bony tenderness or effusion noted on exam today. No ligamentous laxity of MCL, LCL, anterior cruciate ligament, or meniscal popping on McMurray's exam. Suspect uncomplicated knee injury. Will have patient follow-up with orthopedic follow-up as needed. Patient given instructions to elevate and ice. Knee immobilizer given. Ibuprofen for pain control. Patient is in understanding and agrees with the plan of care.  New Prescriptions New Prescriptions   No medications  on file     Tamala Julian 03/07/17 1903    Little, Wenda Overland, MD 03/09/17 1150

## 2017-03-07 NOTE — ED Triage Notes (Signed)
Patient here from home with complaints of bilateral knee pain, increased on left side after fall yesterday. Ambulatory.

## 2017-06-04 ENCOUNTER — Encounter (HOSPITAL_COMMUNITY): Payer: Self-pay | Admitting: Emergency Medicine

## 2017-06-04 ENCOUNTER — Emergency Department (HOSPITAL_COMMUNITY)
Admission: EM | Admit: 2017-06-04 | Discharge: 2017-06-04 | Disposition: A | Discharge status: Home / Self Care | Payer: Self-pay | Attending: Emergency Medicine | Admitting: Emergency Medicine

## 2017-06-04 DIAGNOSIS — Z79899 Other long term (current) drug therapy: Secondary | ICD-10-CM | POA: Insufficient documentation

## 2017-06-04 DIAGNOSIS — R112 Nausea with vomiting, unspecified: Secondary | ICD-10-CM | POA: Insufficient documentation

## 2017-06-04 DIAGNOSIS — R197 Diarrhea, unspecified: Secondary | ICD-10-CM | POA: Insufficient documentation

## 2017-06-04 DIAGNOSIS — G43809 Other migraine, not intractable, without status migrainosus: Secondary | ICD-10-CM

## 2017-06-04 DIAGNOSIS — G43909 Migraine, unspecified, not intractable, without status migrainosus: Secondary | ICD-10-CM | POA: Insufficient documentation

## 2017-06-04 LAB — COMPREHENSIVE METABOLIC PANEL
ALBUMIN: 4.1 g/dL (ref 3.5–5.0)
ALK PHOS: 76 U/L (ref 38–126)
ALT: 23 U/L (ref 17–63)
AST: 27 U/L (ref 15–41)
Anion gap: 10 (ref 5–15)
BUN: 16 mg/dL (ref 6–20)
CALCIUM: 9.2 mg/dL (ref 8.9–10.3)
CHLORIDE: 102 mmol/L (ref 101–111)
CO2: 26 mmol/L (ref 22–32)
CREATININE: 1.03 mg/dL (ref 0.61–1.24)
GFR calc Af Amer: >60 mL/min (ref >60)
GFR calc non Af Amer: >60 mL/min (ref >60)
GLUCOSE: 160 mg/dL — AB (ref 65–99)
Potassium: 3.8 mmol/L (ref 3.5–5.1)
SODIUM: 138 mmol/L (ref 135–145)
Total Bilirubin: 0.7 mg/dL (ref 0.3–1.2)
Total Protein: 8 g/dL (ref 6.5–8.1)

## 2017-06-04 LAB — URINALYSIS, ROUTINE W REFLEX MICROSCOPIC
Bacteria, UA: NONE SEEN
Bilirubin Urine: NEGATIVE
GLUCOSE, UA: NEGATIVE mg/dL
Hgb urine dipstick: NEGATIVE
KETONES UR: 5 mg/dL — AB
Leukocytes, UA: NEGATIVE
Nitrite: NEGATIVE
PH: 5 (ref 5.0–8.0)
PROTEIN: 30 mg/dL — AB
Specific Gravity, Urine: 1.034 — ABNORMAL HIGH (ref 1.005–1.030)

## 2017-06-04 LAB — CBC
HCT: 48.2 % (ref 39.0–52.0)
Hemoglobin: 16.4 g/dL (ref 13.0–17.0)
MCH: 30.2 pg (ref 26.0–34.0)
MCHC: 34 g/dL (ref 30.0–36.0)
MCV: 88.8 fL (ref 78.0–100.0)
PLATELETS: 282 10*3/uL (ref 150–400)
RBC: 5.43 MIL/uL (ref 4.22–5.81)
RDW: 12.3 % (ref 11.5–15.5)
WBC: 10.5 10*3/uL (ref 4.0–10.5)

## 2017-06-04 LAB — LIPASE, BLOOD: Lipase: 26 U/L (ref 11–51)

## 2017-06-04 MED ORDER — ACETAMINOPHEN 325 MG PO TABS
650.0000 mg | ORAL_TABLET | Freq: Once | ORAL | Status: AC
  Administered 2017-06-04: 650 mg via ORAL
  Filled 2017-06-04: qty 2

## 2017-06-04 MED ORDER — ONDANSETRON 4 MG PO TBDP: 4.0000 mg | ORAL_TABLET | Freq: Three times a day (TID) | ORAL | 0 refills | Status: DC | PRN

## 2017-06-04 MED ORDER — KETOROLAC TROMETHAMINE 60 MG/2ML IM SOLN
30.0000 mg | Freq: Once | INTRAMUSCULAR | Status: AC
  Administered 2017-06-04: 30 mg via INTRAMUSCULAR
  Filled 2017-06-04: qty 2

## 2017-06-04 MED ORDER — ONDANSETRON 4 MG PO TBDP
4.0000 mg | ORAL_TABLET | Freq: Once | ORAL | Status: AC
  Administered 2017-06-04: 4 mg via ORAL
  Filled 2017-06-04: qty 1

## 2017-06-04 NOTE — Discharge Instructions (Signed)
Please read attached information regarding your condition. Take Zofran as needed for nausea. Follow-up at [facility] for further evaluation. Take Tylenol or ibuprofen as needed for headaches. Return to ED for worsening headache, head injuries, increased vomiting, lightheadedness or loss of consciousness.

## 2017-06-04 NOTE — ED Triage Notes (Signed)
Patient c/o headache, abdominal pain, N/V/D x2 days. Reports "I think I have a stomach virus."

## 2017-06-04 NOTE — ED Provider Notes (Signed)
[facility] DEPT Provider Note   CSN: [id number] Arrival date & time: 06/04/17  1944     History   Chief Complaint Chief Complaint  Patient presents with  . Headache  . Emesis  . Diarrhea    HPI Bradley Larson is a 21 y.o. male with a past medical history of migraines, who presents to ED for evaluation of 1-1/2-day history of abdominal pain, 2-3 episodes of NBNB vomiting and 5-6 episodes of diarrhea as well as headache.  He reports headache is associated with photophobia and phonophobia similar to his prior migraines.  Reports that abdominal pain is improved after having a bowel movement.  He has not tried any over-the-counter medications to help with any of his symptoms.  No sick contacts with similar symptoms.  Unsure if he had any recent suspicious food ingestions that could have brought on his symptoms.  He denies any head injuries, chest pain, shortness of breath, prior abdominal surgeries, hematochezia or melena, fevers, urinary symptoms, vision changes.  He reports occasional alcohol use but nothing in the past few weeks.  He denies any tobacco or other drug use.  HPI  Past Medical History:  Diagnosis Date  . Meniscus tear   . Migraines     There are no active problems to display for this patient.   Past Surgical History:  Procedure Laterality Date  . FRACTURE SURGERY Right    thumb       Home Medications    Prior to Admission medications   Medication Sig Start Date End Date Taking? Authorizing Provider  benzonatate (TESSALON) 100 MG capsule Take 1 capsule (100 mg total) by mouth every 8 (eight) hours. Patient not taking: Reported on 03/07/2017 06/12/16   Larene Pickett, PA-C  cyclobenzaprine (FLEXERIL) 10 MG tablet Take 1 tablet (10 mg total) by mouth at bedtime and may repeat dose one time if needed. Patient not taking: Reported on 03/07/2017 07/10/16   Recardo Evangelist, PA-C  ibuprofen (ADVIL,MOTRIN) 600 MG tablet Take 1 tablet  (600 mg total) by mouth every 6 (six) hours as needed. 03/07/17   Langston Masker B, PA-C  meloxicam (MOBIC) 15 MG tablet Take 1 tablet (15 mg total) by mouth daily. Patient not taking: Reported on 03/07/2017 02/14/16   Jeannett Senior, PA-C  miconazole (MICOTIN) 2 % cream Apply 1 application topically 2 (two) times daily. Patient not taking: Reported on 03/07/2017 12/21/15   Palumbo, April, MD  naproxen (NAPROSYN) 500 MG tablet Take 1 tablet (500 mg total) by mouth 2 (two) times daily. Patient not taking: Reported on 03/07/2017 10/04/15   Delos Haring, PA-C  ondansetron (ZOFRAN ODT) 4 MG disintegrating tablet Take 1 tablet (4 mg total) by mouth every 8 (eight) hours as needed for nausea or vomiting. 06/04/17   Deanna Boehlke, PA-C  phenol (CHLORASEPTIC) 1.4 % LIQD Use as directed 1 spray in the mouth or throat as needed for throat irritation / pain. Patient not taking: Reported on 03/07/2017 06/12/16   Larene Pickett, PA-C    Family History Family History  Problem Relation Age of Onset  . Asthma Mother   . Diabetes Other   . Hypertension Other   . CAD Other     Social History Social History   Tobacco Use  . Smoking status: Never Smoker  . Smokeless tobacco: Never Used  Substance Use Topics  . Alcohol use: Yes    Comment: Twice a month  . Drug use: No  Allergies   Patient has no known allergies.   Review of Systems Review of Systems  Constitutional: Negative for appetite change, chills and fever.  HENT: Negative for ear pain, rhinorrhea, sneezing and sore throat.   Eyes: Negative for photophobia and visual disturbance.  Respiratory: Negative for cough, chest tightness, shortness of breath and wheezing.   Cardiovascular: Negative for chest pain and palpitations.  Gastrointestinal: Positive for abdominal pain, diarrhea, nausea and vomiting. Negative for blood in stool and constipation.  Genitourinary: Negative for dysuria, hematuria and urgency.  Musculoskeletal: Negative  for myalgias.  Skin: Negative for rash.  Neurological: Positive for headaches. Negative for dizziness, weakness and light-headedness.     Physical Exam Updated Vital Signs BP 134/80 (BP Location: Left Arm)   Pulse 81   Temp 98.1 F (36.7 C) (Oral)   Resp 17   SpO2 97%   Physical Exam  Constitutional: He is oriented to person, place, and time. He appears well-developed and well-nourished. No distress.  Nontoxic appearing and in no acute distress.  Appears comfortable sitting on exam table.  HENT:  Head: Normocephalic and atraumatic.  Nose: Nose normal.  Eyes: Conjunctivae and EOM are normal. Pupils are equal, round, and reactive to light. Right eye exhibits no discharge. Left eye exhibits no discharge. No scleral icterus.  Neck: Normal range of motion. Neck supple.  Cardiovascular: Normal rate, regular rhythm, normal heart sounds and intact distal pulses. Exam reveals no gallop and no friction rub.  No murmur heard. Pulmonary/Chest: Effort normal and breath sounds normal. No respiratory distress.  Abdominal: Soft. Bowel sounds are normal. He exhibits no distension. There is no tenderness. There is no guarding.  No abdominal tenderness to palpation.  Musculoskeletal: Normal range of motion. He exhibits no edema.  Neurological: He is alert and oriented to person, place, and time. No cranial nerve deficit or sensory deficit. He exhibits normal muscle tone. Coordination normal.  Pupils reactive. No facial asymmetry noted. Cranial nerves appear grossly intact. Sensation intact to light touch on face, BUE and BLE. Strength 5/5 in BUE and BLE. Normal finger to nose coordination bilaterally.  Skin: Skin is warm and dry. No rash noted.  Psychiatric: He has a normal mood and affect.  Nursing note and vitals reviewed.    ED Treatments / Results  Labs (all labs ordered are listed, but only abnormal results are displayed) Labs Reviewed  COMPREHENSIVE METABOLIC PANEL - Abnormal; Notable for  the following components:      Result Value   Glucose, Bld 160 (*)    All other components within normal limits  LIPASE, BLOOD  CBC  URINALYSIS, ROUTINE W REFLEX MICROSCOPIC    EKG  EKG Interpretation None       Radiology No results found.  Procedures Procedures (including critical care time)  Medications Ordered in ED Medications  acetaminophen (TYLENOL) tablet 650 mg (650 mg Oral Given 06/04/17 2127)  ondansetron (ZOFRAN-ODT) disintegrating tablet 4 mg (4 mg Oral Given 06/04/17 2129)  ketorolac (TORADOL) injection 30 mg (30 mg Intramuscular Given 06/04/17 2129)     Initial Impression / Assessment and Plan / ED Course  I have reviewed the triage vital signs and the nursing notes.  Pertinent labs & imaging results that were available during my care of the patient were reviewed by me and considered in my medical decision making (see chart for details).     Patient presents to ED for evaluation of 1-1/2-day history of abdominal pain, 2-3 episodes of vomiting and 5-6 episodes of  diarrhea as well as a headache.  He does have a history of migraines and states that this headache is similar.  He has not tried any over-the-counter medications to help with any of his symptoms.  On physical exam patient is overall well-appearing.  He has no deficits on his neurological exam.  He does not appear dehydrated and has no abdominal tenderness to palpation.  He is ambulatory with normal gait.  He is afebrile with no history of fever.  Lab work including CBC, CMP, lipase unremarkable.  Patient reports complete resolution of his symptoms with Toradol, Tylenol and Zofran.  I suspect that his symptoms are could be due to a viral gastroenteritis as well as a migraine.  Advised patient to take Zofran as needed for nausea and Tylenol and ibuprofen as needed for headaches.  I have low suspicion for acute intracranial abnormality being the cause of a headache or acute intra-abdominal surgical emergency  with the cause of his abdominal symptoms.  Patient appears stable for discharge at this time.  Strict return precautions given.  Final Clinical Impressions(s) / ED Diagnoses   Final diagnoses:  Other migraine without status migrainosus, not intractable    ED Discharge Orders        Ordered    ondansetron (ZOFRAN ODT) 4 MG disintegrating tablet  Every 8 hours PRN     06/04/17 2306     Portions of this note were generated with Dragon dictation software. Dictation errors may occur despite best attempts at proofreading.    Delia Heady, PA-C 06/04/17 2311    Tegeler, Gwenyth Allegra, MD 06/05/17 [phone]

## 2017-07-02 ENCOUNTER — Emergency Department (HOSPITAL_COMMUNITY)
Admission: EM | Admit: 2017-07-02 | Discharge: 2017-07-02 | Disposition: A | Discharge status: Home / Self Care | Payer: Self-pay | Attending: Emergency Medicine | Admitting: Emergency Medicine

## 2017-07-02 ENCOUNTER — Other Ambulatory Visit: Payer: Self-pay

## 2017-07-02 ENCOUNTER — Encounter (HOSPITAL_COMMUNITY): Payer: Self-pay

## 2017-07-02 ENCOUNTER — Emergency Department (HOSPITAL_COMMUNITY): Payer: Self-pay

## 2017-07-02 DIAGNOSIS — Z79899 Other long term (current) drug therapy: Secondary | ICD-10-CM | POA: Insufficient documentation

## 2017-07-02 DIAGNOSIS — M25561 Pain in right knee: Secondary | ICD-10-CM | POA: Insufficient documentation

## 2017-07-02 MED ORDER — IBUPROFEN 800 MG PO TABS: 800.0000 mg | ORAL_TABLET | Freq: Three times a day (TID) | ORAL | 0 refills | Status: DC | PRN

## 2017-07-02 NOTE — ED Provider Notes (Signed)
[facility] DEPT Provider Note   CSN: [id number] Arrival date & time: 07/02/17  1914     History   Chief Complaint Chief Complaint  Patient presents with  . Knee Pain    HPI Bradley Larson is a 22 y.o. male.  The history is provided by the patient and medical records. No language interpreter was used.  Knee Pain   Pertinent negatives include no numbness.   Bradley Larson is a 22 y.o. male who presents to the Emergency Department complaining of intermittent right knee pain. Patient states that he initially injured his knee back in September of 2018. He was told that he likely tore his meniscus but that it should heal on it's own. He felt like symptoms did improve, therefore did not follow up with orthopedics. Over the last few weeks, he has been working a job where he must be on his feet for 8-9 hours a day. The days that he works, he feels like his knee gets tight and will be sore on both sides. Sometimes his knee will lock up on him. This occurs once or twice a week and quickly resolves. Often when he bends his knee on his lap while tying his shoes is when this occurs. Ambulatory without limp. Has tried taking ibuprofen a couple times over the last week with little improvement. No numbness, tingling or weakness.  Past Medical History:  Diagnosis Date  . Meniscus tear   . Migraines     There are no active problems to display for this patient.   Past Surgical History:  Procedure Laterality Date  . FRACTURE SURGERY Right    thumb       Home Medications    Prior to Admission medications   Medication Sig Start Date End Date Taking? Authorizing Provider  benzonatate (TESSALON) 100 MG capsule Take 1 capsule (100 mg total) by mouth every 8 (eight) hours. Patient not taking: Reported on 03/07/2017 06/12/16   Larene Pickett, PA-C  cyclobenzaprine (FLEXERIL) 10 MG tablet Take 1 tablet (10 mg total) by mouth at bedtime and may repeat dose one time  if needed. Patient not taking: Reported on 03/07/2017 07/10/16   Recardo Evangelist, PA-C  ibuprofen (ADVIL,MOTRIN) 800 MG tablet Take 1 tablet (800 mg total) by mouth every 8 (eight) hours as needed. 07/02/17   Deliliah Spranger, Ozella Almond, PA-C  meloxicam (MOBIC) 15 MG tablet Take 1 tablet (15 mg total) by mouth daily. Patient not taking: Reported on 03/07/2017 02/14/16   Jeannett Senior, PA-C  miconazole (MICOTIN) 2 % cream Apply 1 application topically 2 (two) times daily. Patient not taking: Reported on 03/07/2017 12/21/15   Palumbo, April, MD  naproxen (NAPROSYN) 500 MG tablet Take 1 tablet (500 mg total) by mouth 2 (two) times daily. Patient not taking: Reported on 03/07/2017 10/04/15   Delos Haring, PA-C  ondansetron (ZOFRAN ODT) 4 MG disintegrating tablet Take 1 tablet (4 mg total) by mouth every 8 (eight) hours as needed for nausea or vomiting. 06/04/17   Khatri, Hina, PA-C  phenol (CHLORASEPTIC) 1.4 % LIQD Use as directed 1 spray in the mouth or throat as needed for throat irritation / pain. Patient not taking: Reported on 03/07/2017 06/12/16   Larene Pickett, PA-C    Family History Family History  Problem Relation Age of Onset  . Asthma Mother   . Diabetes Other   . Hypertension Other   . CAD Other     Social History Social History  Tobacco Use  . Smoking status: Never Smoker  . Smokeless tobacco: Never Used  Substance Use Topics  . Alcohol use: Yes    Comment: Twice a month  . Drug use: No     Allergies   Patient has no known allergies.   Review of Systems Review of Systems  Constitutional: Negative for fever.  Musculoskeletal: Positive for arthralgias. Negative for back pain.  Skin: Negative for color change and wound.  Neurological: Negative for weakness and numbness.     Physical Exam Updated Vital Signs BP 131/81 (BP Location: Right Arm)   Pulse 85   Temp 98 F (36.7 C) (Oral)   Resp 16   Ht 6\' 3"  (1.905 m)   Wt (!) 136.5 kg (301 lb)   SpO2 97%   BMI  37.62 kg/m   Physical Exam  Constitutional: He appears well-developed and well-nourished. No distress.  HENT:  Head: Normocephalic and atraumatic.  Neck: Neck supple.  Cardiovascular: Normal rate, regular rhythm and normal heart sounds.  No murmur heard. Pulmonary/Chest: Effort normal and breath sounds normal. No respiratory distress. He has no wheezes. He has no rales.  Musculoskeletal:  Mild tenderness to bilateral anterior aspects of the knee. Full ROM. No joint line tenderness. No joint effusion or swelling appreciated. No abnormal alignment or patellar mobility. No bruising, erythema or warmth overlaying the joint. No varus/valgus laxity. Negative drawer's, Lachman's and McMurray's.  No crepitus.  2+ DP pulses bilaterally. All compartments are soft. Sensation intact distal to injury.  Neurological: He is alert.  Skin: Skin is warm and dry.  Nursing note and vitals reviewed.    ED Treatments / Results  Labs (all labs ordered are listed, but only abnormal results are displayed) Labs Reviewed - No data to display  EKG  EKG Interpretation None       Radiology Dg Knee Complete 4 Views Right  Result Date: 07/02/2017 CLINICAL DATA:  Diffuse right knee pain for the past 4 years following injury while playing basketball. EXAM: RIGHT KNEE - COMPLETE 4+ VIEW COMPARISON:  Right knee series dated January 09, 2016 FINDINGS: The bones are subjectively adequately mineralized. The joint spaces are well maintained. There beaking of the tibial spines. There is a small spur arising from the inferior articular margin of the patella. There is no joint effusion. 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 No acute bony abnormality. Electronically Signed   By: David  Martinique M.D.   On: 07/02/2017 08:19    Procedures Procedures (including critical care time)  Medications Ordered in ED Medications - No data to  display   Initial Impression / Assessment and Plan / ED Course  I have reviewed the triage vital signs and the nursing notes.  Pertinent labs & imaging results that were available during my care of the patient were reviewed by me and considered in my medical decision making (see chart for details).    Bradley Larson is a 22 y.o. male who presents to ED for intermittent right knee pain since injury in September 2018. Acutely worsened over the last few weeks since working a job where he is up on his feet for 8-9 hours at a time. NVI on exam. X-ray with no acute findings but does show osteoarthritic spurring. Knee sleeve provided. Symptomatic home care discussed. Orthopedic follow up recommended. All questions answered.    Final Clinical Impressions(s) / ED Diagnoses   Final diagnoses:  Right  knee pain, unspecified chronicity    ED Discharge Orders        Ordered    ibuprofen (ADVIL,MOTRIN) 800 MG tablet  Every 8 hours PRN     07/02/17 0955       Keshonda Monsour, Ozella Almond, PA-C 07/02/17 1006    Lacretia Leigh, MD 07/03/17 2146

## 2017-07-02 NOTE — ED Triage Notes (Signed)
Patient c/o right knee pain since yesterday. Patient states he has been taking Ibuprofen with no relief. Patient states he has a job where he has to stand for long periods of time.

## 2017-07-02 NOTE — Discharge Instructions (Signed)
It was my pleasure taking care of you today!   Ibuprofen as needed for pain. Ice and elevate knee throughout the day.  Call the orthopedist listed today or tomorrow to schedule follow up appointment.  Return to the ER for new or worsening symptoms, any additional concerns.

## 2017-07-02 NOTE — ED Notes (Signed)
Bed: WTR6 Expected date:  Expected time:  Means of arrival:  Comments: 

## 2017-08-05 ENCOUNTER — Encounter (HOSPITAL_COMMUNITY): Payer: Self-pay

## 2017-08-05 ENCOUNTER — Emergency Department (HOSPITAL_COMMUNITY): Payer: Self-pay

## 2017-08-05 ENCOUNTER — Other Ambulatory Visit: Payer: Self-pay

## 2017-08-05 ENCOUNTER — Emergency Department (HOSPITAL_COMMUNITY)
Admission: EM | Admit: 2017-08-05 | Discharge: 2017-08-05 | Disposition: A | Discharge status: Home / Self Care | Payer: Self-pay | Attending: Emergency Medicine | Admitting: Emergency Medicine

## 2017-08-05 DIAGNOSIS — H6692 Otitis media, unspecified, left ear: Secondary | ICD-10-CM | POA: Insufficient documentation

## 2017-08-05 DIAGNOSIS — H6092 Unspecified otitis externa, left ear: Secondary | ICD-10-CM | POA: Insufficient documentation

## 2017-08-05 DIAGNOSIS — H6122 Impacted cerumen, left ear: Secondary | ICD-10-CM | POA: Insufficient documentation

## 2017-08-05 DIAGNOSIS — J069 Acute upper respiratory infection, unspecified: Secondary | ICD-10-CM | POA: Insufficient documentation

## 2017-08-05 DIAGNOSIS — H9202 Otalgia, left ear: Secondary | ICD-10-CM

## 2017-08-05 DIAGNOSIS — I10 Essential (primary) hypertension: Secondary | ICD-10-CM | POA: Insufficient documentation

## 2017-08-05 MED ORDER — OFLOXACIN 0.3 % OT SOLN: 10.0000 [drp] | Freq: Every day | OTIC | 0 refills | Status: AC

## 2017-08-05 MED ORDER — AMOXICILLIN 500 MG PO CAPS: 500.0000 mg | ORAL_CAPSULE | Freq: Two times a day (BID) | ORAL | 0 refills | Status: AC

## 2017-08-05 MED ORDER — BENZONATATE 100 MG PO CAPS: 100.0000 mg | ORAL_CAPSULE | Freq: Three times a day (TID) | ORAL | 0 refills | Status: DC

## 2017-08-05 MED ORDER — GUAIFENESIN ER 600 MG PO TB12: 600.0000 mg | ORAL_TABLET | Freq: Two times a day (BID) | ORAL | 0 refills | Status: DC

## 2017-08-05 NOTE — Discharge Instructions (Signed)
You were given drops for your ear to treat an infection in your ear canal.  You were also given an antibiotic to take by mouth to treat an infection to the inside of your ear.  You were also given cough medicine for your cough as well as Mucinex to help with your chest congestion.  Please make sure to take all these medications as directed.  Check with your pharmacy for potential side effects or adverse reactions.  Make sure to check for potential drug interactions for other medications that you are taking.  Please follow-up with primary care within 1 week for reevaluation.  Return to the ER sooner if you have any new or worsening symptoms.  You were noted to have high blood pressure today during your visit in the emergency department. You will need to follow up with your primary healthcare provider to have your blood pressure rechecked as you may need to be started on medication for this if it remains elevated. If you experience any chest pain, shortness of breath, headaches, vision changes, numbness, weakness, lightheadedness, changes in mental status, or decrease in urination you should return to the emergency department immediately.

## 2017-08-05 NOTE — ED Triage Notes (Signed)
Patient presents with new onset flu- like symptoms starting Friday evening. Patient reports new onset nonproductive cough, nasal congestion, nasal drainage, headache, generalized body aches, and "feeling hot." Patient denies sore throat. Patient unsure of if he has had fevers. Patient denies chest pain. Patient reports feeling "a tightness in my chest when I cough." Patient reports his coworkers are all sick with similar symptoms.

## 2017-08-05 NOTE — ED Provider Notes (Addendum)
[facility] DEPT Provider Note   CSN: [id number] Arrival date & time: 08/05/17  1128     History   Chief Complaint Chief Complaint  Patient presents with  . Cough  . Fatigue    HPI KOSTANTINOS TALLMAN is a 22 y.o. male.  HPI   Patient is a 22 year old male who presents the ED complaining of sore throat, cough, chest congestion, headaches, body aches that began 3 days ago.  States he has had a dry cough and has not been able to cough up any mucus.  He also reports chest tenderness to the mid chest when he coughs or sneezes.  Denies any chest pain at rest.  Denies any shortness of breath.  States that his headaches have been intermittent and gradual in onset.  They are not the worst headaches of his life.  He does have a history of migraines and states this is similar to this.  Denies any vision changes, lightheadedness, dizziness, numbness or weakness to the arms or legs.  He states that he overall feels fatigued, has nasal congestion, rhinorrhea, left ear pain, left ear fullness, and sweats.  He has not taken any medications to help his symptoms.  Past Medical History:  Diagnosis Date  . Meniscus tear   . Migraines     There are no active problems to display for this patient.   Past Surgical History:  Procedure Laterality Date  . FRACTURE SURGERY Right    thumb       Home Medications    Prior to Admission medications   Medication Sig Start Date End Date Taking? Authorizing Provider  amoxicillin (AMOXIL) 500 MG capsule Take 1 capsule (500 mg total) by mouth 2 (two) times daily for 7 days. 08/05/17 08/12/17  Anisah Kuck S, PA-C  benzonatate (TESSALON) 100 MG capsule Take 1 capsule (100 mg total) by mouth every 8 (eight) hours. 08/05/17   Vandana Haman S, PA-C  cyclobenzaprine (FLEXERIL) 10 MG tablet Take 1 tablet (10 mg total) by mouth at bedtime and may repeat dose one time if needed. Patient not taking: Reported on 03/07/2017 07/10/16    Recardo Evangelist, PA-C  guaiFENesin (MUCINEX) 600 MG 12 hr tablet Take 1 tablet (600 mg total) by mouth 2 (two) times daily. 08/05/17   Evy Lutterman S, PA-C  ibuprofen (ADVIL,MOTRIN) 800 MG tablet Take 1 tablet (800 mg total) by mouth every 8 (eight) hours as needed. 07/02/17   Ward, Ozella Almond, PA-C  meloxicam (MOBIC) 15 MG tablet Take 1 tablet (15 mg total) by mouth daily. Patient not taking: Reported on 03/07/2017 02/14/16   Jeannett Senior, PA-C  miconazole (MICOTIN) 2 % cream Apply 1 application topically 2 (two) times daily. Patient not taking: Reported on 03/07/2017 12/21/15   Palumbo, April, MD  naproxen (NAPROSYN) 500 MG tablet Take 1 tablet (500 mg total) by mouth 2 (two) times daily. Patient not taking: Reported on 03/07/2017 10/04/15   Delos Haring, PA-C  ofloxacin (FLOXIN) 0.3 % OTIC solution Place 10 drops into the left ear daily for 7 days. 08/05/17 08/12/17  Estha Few S, PA-C  ondansetron (ZOFRAN ODT) 4 MG disintegrating tablet Take 1 tablet (4 mg total) by mouth every 8 (eight) hours as needed for nausea or vomiting. 06/04/17   Khatri, Hina, PA-C  phenol (CHLORASEPTIC) 1.4 % LIQD Use as directed 1 spray in the mouth or throat as needed for throat irritation / pain. Patient not taking: Reported on 03/07/2017 06/12/16   [name]  M, PA-C    Family History Family History  Problem Relation Age of Onset  . Asthma Mother   . Diabetes Other   . Hypertension Other   . CAD Other     Social History Social History   Tobacco Use  . Smoking status: Never Smoker  . Smokeless tobacco: Never Used  Substance Use Topics  . Alcohol use: Yes    Comment: Twice a month  . Drug use: No     Allergies   Patient has no known allergies.   Review of Systems Review of Systems  Constitutional: Positive for chills and fatigue.  HENT: Positive for congestion, ear pain, postnasal drip, rhinorrhea, sneezing and sore throat. Negative for trouble swallowing.   Eyes: Negative for  pain and visual disturbance.  Respiratory: Positive for cough. Negative for shortness of breath.   Cardiovascular:       Chest pain with cough  Gastrointestinal: Negative for abdominal pain, diarrhea, nausea and vomiting.  Genitourinary: Negative for dysuria and frequency.  Musculoskeletal: Positive for myalgias. Negative for neck pain and neck stiffness.  Neurological: Positive for headaches. Negative for dizziness, weakness and light-headedness.     Physical Exam Updated Vital Signs BP (!) 156/107 (BP Location: Right Arm)   Pulse 80   Temp 98.1 F (36.7 C) (Oral)   Resp 18   Ht 6\' 3"  (1.905 m)   Wt 134.7 kg (297 lb)   SpO2 100%   BMI 37.12 kg/m   Physical Exam  Constitutional: He is oriented to person, place, and time. He appears well-developed and well-nourished.  HENT:  Head: Normocephalic and atraumatic.  Mild pharyngeal erythema, tonsils 1+ bilaterally.  No exudates.  Uvula midline.  No evidence of PTA.  Normal voice.  Nose normal.  Mucous membranes moist.  Right TM normal.  Left TM with cerumen impaction.  White material to the inferior portion of the canal consistent with otitis externa.  No mastoid tenderness.  No erythema to the mastoid.  Eyes: Conjunctivae and EOM are normal. Pupils are equal, round, and reactive to light.  Neck: Normal range of motion. Neck supple.  No nuchal rigidity.  Cardiovascular: Normal rate, regular rhythm, normal heart sounds and intact distal pulses.  No murmur heard. Pulmonary/Chest: Effort normal and breath sounds normal. No respiratory distress. He has no wheezes. He has no rales.  Abdominal: Soft. Bowel sounds are normal. He exhibits no distension. There is no tenderness.  Musculoskeletal: He exhibits no edema.  Lymphadenopathy:    He has no cervical adenopathy.  Neurological: He is alert and oriented to person, place, and time. No cranial nerve deficit.  Skin: Skin is warm and dry. Capillary refill takes less than 2 seconds.    Psychiatric: He has a normal mood and affect.  Nursing note and vitals reviewed.    ED Treatments / Results  Labs (all labs ordered are listed, but only abnormal results are displayed) Labs Reviewed - No data to display  EKG  EKG Interpretation None     EKG: normal EKG, normal sinus rhythm, HR 70, no st elevations or depressions, normal PR interval, normal QT interval.   Radiology Dg Chest 2 View  Result Date: 08/05/2017 CLINICAL DATA:  Chest pain and cough. Nonsmoker. No cardiopulmonary history. EXAM: CHEST  2 VIEW COMPARISON:  PA and lateral chest x-ray of February 16, 2016 FINDINGS: The lungs are adequately inflated and clear. The heart and pulmonary vascularity are normal. The mediastinum is normal in width. There is no pleural effusion.  There is gentle dextrocurvature centered in the midthoracic spine which is stable. IMPRESSION: There is no pneumonia nor other acute cardiopulmonary abnormality. Electronically Signed   By: David  Martinique M.D.   On: 08/05/2017 12:36    Procedures Procedures (including critical care time)  Medications Ordered in ED Medications - No data to display   Initial Impression / Assessment and Plan / ED Course  I have reviewed the triage vital signs and the nursing notes.  Pertinent labs & imaging results that were available during my care of the patient were reviewed by me and considered in my medical decision making (see chart for details).      Final Clinical Impressions(s) / ED Diagnoses   Final diagnoses:  Upper respiratory tract infection, unspecified type  Impacted cerumen of left ear  Left ear pain   Patient with symptoms consistent with influenza.  Vitals are stable, with some HTN consistent with patients last visits. no fever. No signs of dehydration.  Lungs are clear.  X-ray negative for pneumonia.  ECG with normal sinus rhythm, no ST elevation or depression.  No arrhythmia. The patient understands that symptoms are greater than  the recommended 24-48 hour window of treatment.  Also with left ear pain, with cerumen impacted on the left.  Attempted cerumen disimpaction and was unsuccessful.  Will give amoxicillin for otitis media, and also treat otitis externa given exam findings also consistent with otitis externa.  Advised follow-up with PCP within 1 week to recheck symptoms.  Patient will be discharged with instructions to orally hydrate, rest, and use over-the-counter medications such as anti-inflammatories ibuprofen and Aleve for muscle aches and Tylenol for fever.  Patient will also be given a cough suppressant.  Given strict return precautions for any new or worsening symptoms.  Patient and his significant other understand the plan and reasons to return.    Also Patient's blood pressure elevated in the emergency department today. On BP recheck on my exam BP was 140/90. Low concern for HTN emergency/urgency given no evidence of EOD. HA today consistent with previous headaches and no neurologic deficit. pt with h/o migraine and flu like sxs today. states headaches have been intermittent during this illness. More likely related to current viral illness. He denies changes in vision, numbness, weakness, chest pain, dyspnea, dizziness, or lightheadedness therefore doubt hypertensive emergency. Discussed elevated blood pressure with the patient and the need for primary care follow up with potential need to initiate or change antihypertensive medications and or for further evaluation. Discussed return precaution signs/symptoms for hypertensive emergency as listed above with the patient. He confirmed understanding.  All questions were answered.  ED Discharge Orders        Ordered    benzonatate (TESSALON) 100 MG capsule  Every 8 hours     08/05/17 1347    guaiFENesin (MUCINEX) 600 MG 12 hr tablet  2 times daily     08/05/17 1347    ofloxacin (FLOXIN) 0.3 % OTIC solution  Daily     08/05/17 1347    amoxicillin (AMOXIL) 500 MG capsule   2 times daily     08/05/17 [place], Keyandre Pileggi S, PA-C 08/05/17 1356    Graciemae Delisle S, PA-C 08/05/17 [place], Mikayla Chiusano S, PA-C 08/06/17 0110    Julianne Rice, MD 08/06/17 [phone]

## 2017-10-31 IMAGING — CR DG ANKLE COMPLETE 3+V*R*
3 series · 3 of 3 positions shown · non-contrast
Comparison: Radiograph dated 11/30/2013

CLINICAL DATA: 19-year-old male with right ankle twisting.

EXAM:
RIGHT ANKLE - COMPLETE 3+ VIEW

[x ankle ap right]
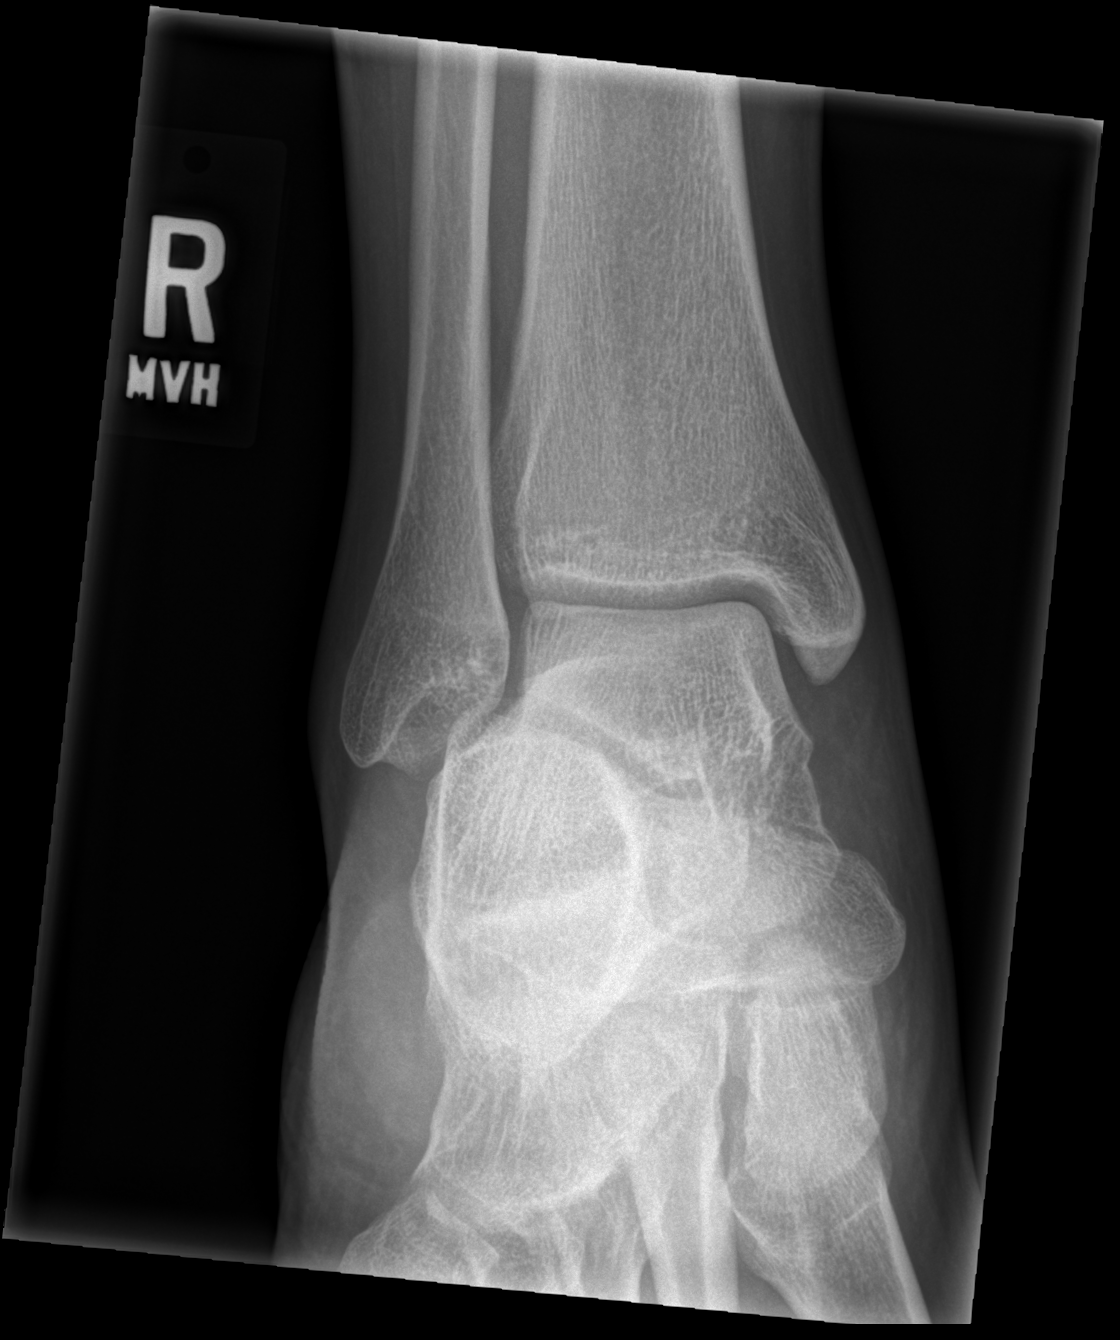

[x ankle obl right]
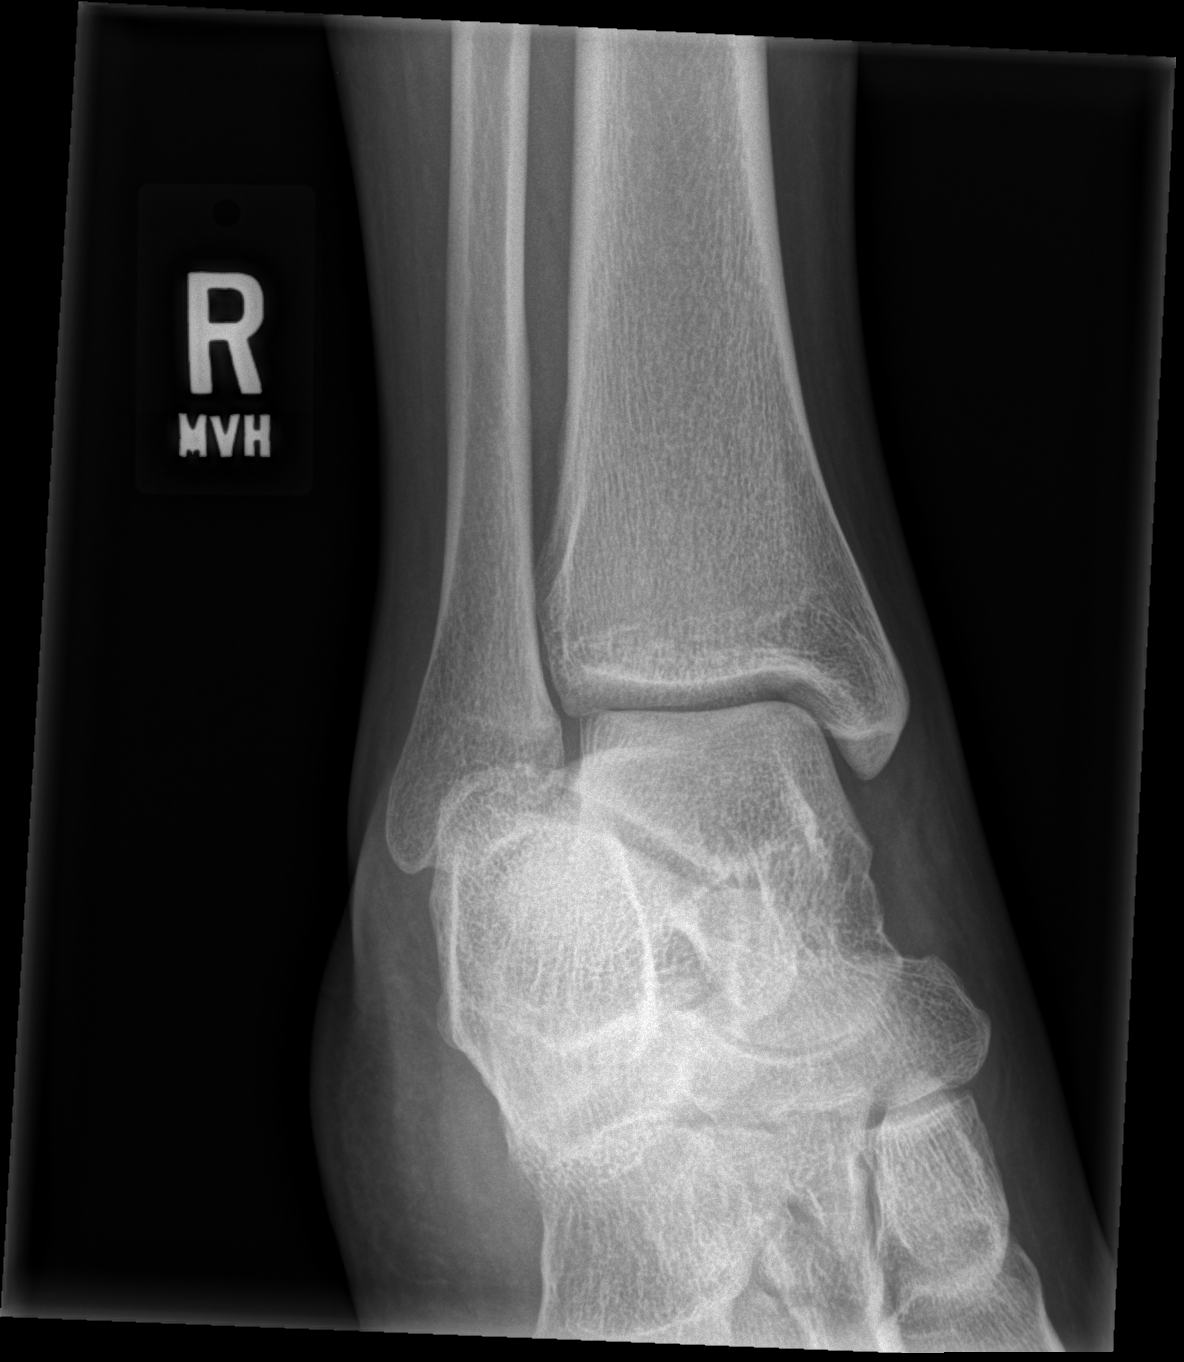

[x ankle lat right]
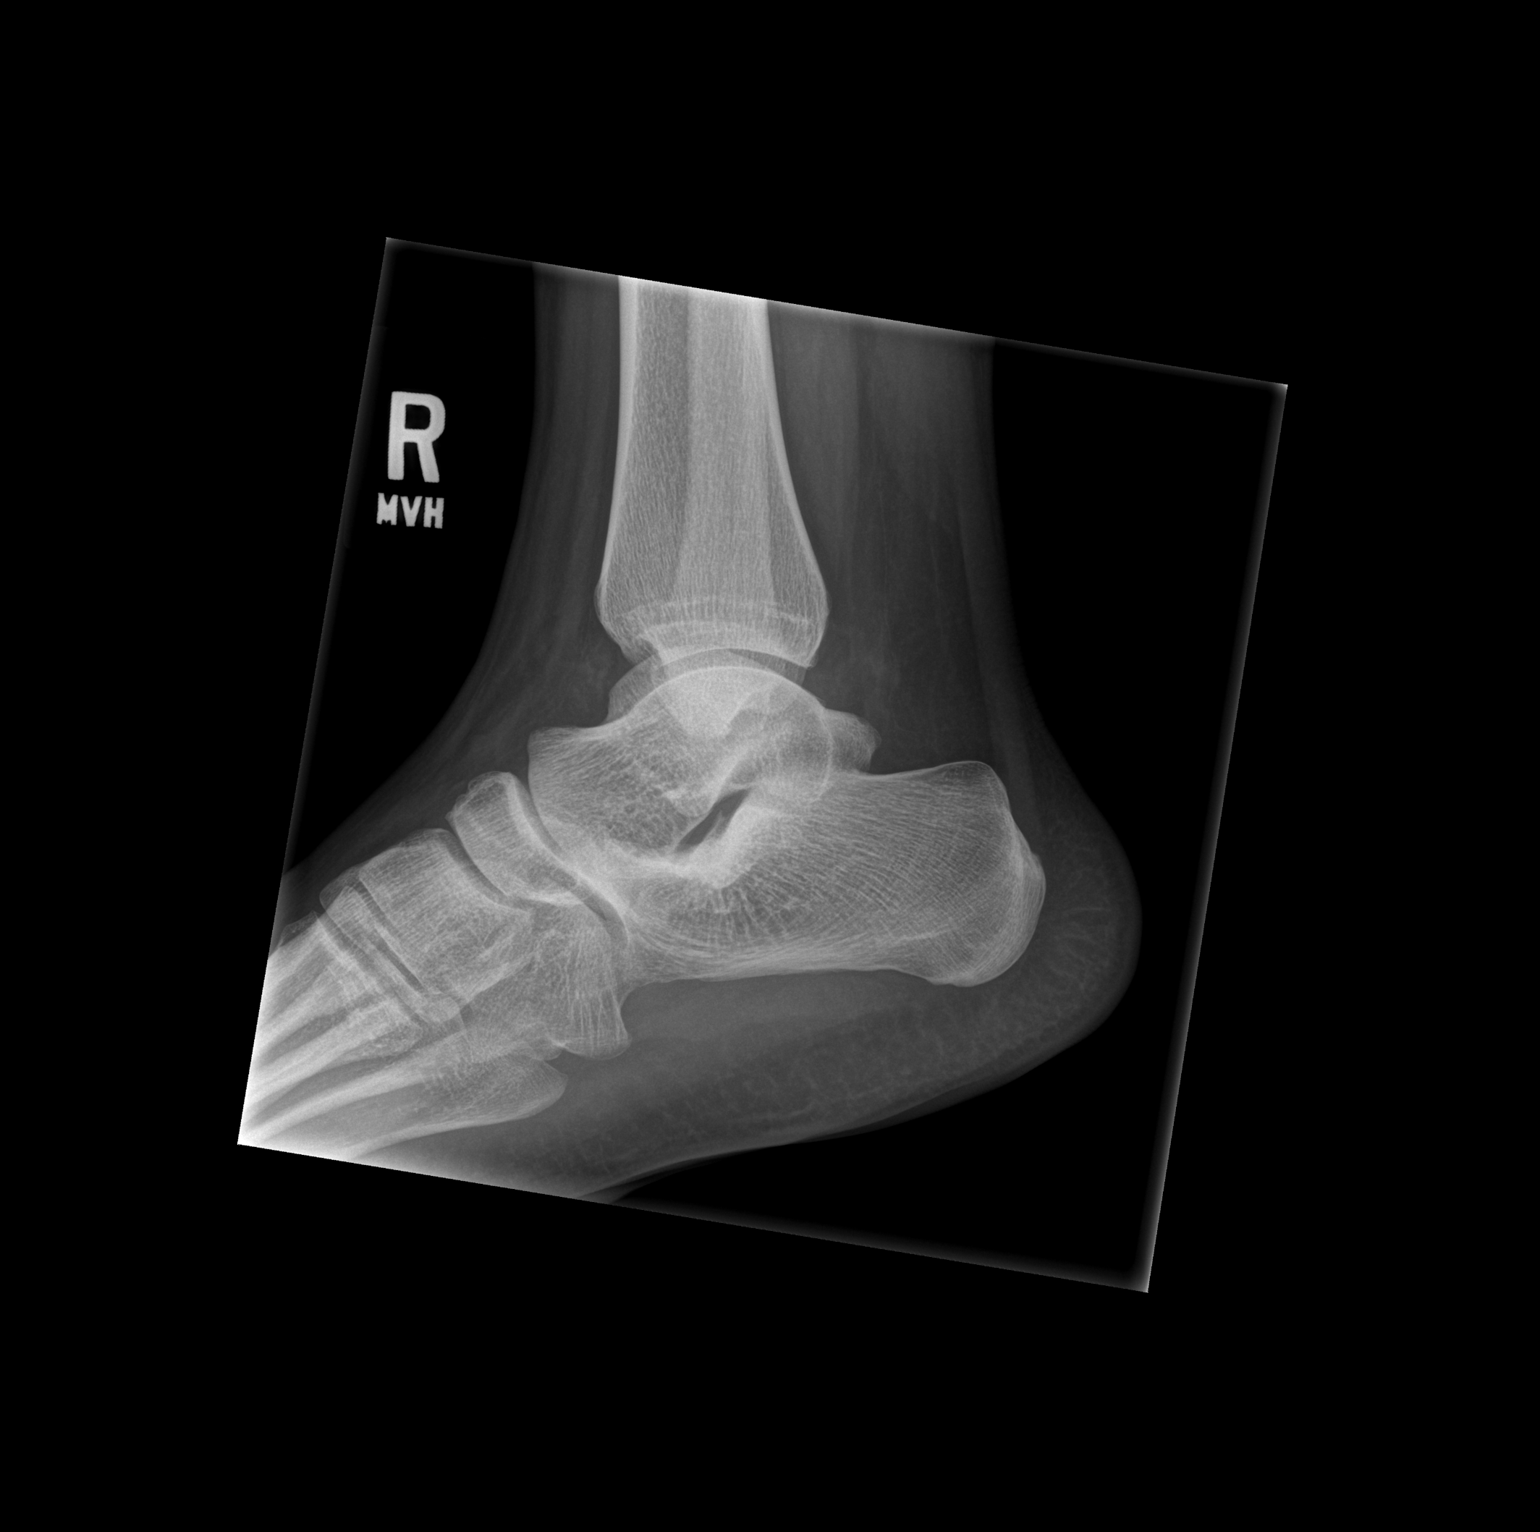

[3 of 3 positions shown; findings below may reference images not displayed]

FINDINGS: There is no evidence of fracture, dislocation, or joint effusion.
There is no evidence of arthropathy or other focal bone abnormality.
Soft tissues are unremarkable.
IMPRESSION: Negative.

## 2017-11-21 ENCOUNTER — Emergency Department (HOSPITAL_COMMUNITY)
Admission: EM | Admit: 2017-11-21 | Discharge: 2017-11-21 | Disposition: A | Discharge status: Home / Self Care | Payer: Self-pay | Attending: Physician Assistant | Admitting: Physician Assistant

## 2017-11-21 ENCOUNTER — Encounter (HOSPITAL_COMMUNITY): Payer: Self-pay

## 2017-11-21 DIAGNOSIS — M545 Low back pain, unspecified: Secondary | ICD-10-CM

## 2017-11-21 MED ORDER — CYCLOBENZAPRINE HCL 10 MG PO TABS: 10.0000 mg | ORAL_TABLET | Freq: Two times a day (BID) | ORAL | 0 refills | Status: DC | PRN

## 2017-11-21 MED ORDER — IBUPROFEN 800 MG PO TABS
800.0000 mg | ORAL_TABLET | Freq: Once | ORAL | Status: AC
  Administered 2017-11-21: 800 mg via ORAL
  Filled 2017-11-21: qty 1

## 2017-11-21 MED ORDER — CYCLOBENZAPRINE HCL 10 MG PO TABS
10.0000 mg | ORAL_TABLET | Freq: Once | ORAL | Status: AC
  Administered 2017-11-21: 10 mg via ORAL
  Filled 2017-11-21: qty 1

## 2017-11-21 MED ORDER — IBUPROFEN 600 MG PO TABS: 600.0000 mg | ORAL_TABLET | Freq: Four times a day (QID) | ORAL | 0 refills | Status: DC | PRN

## 2017-11-21 NOTE — Discharge Instructions (Addendum)
Return if any problems.   Schedule appointment to be seen for recheck of blood pressure

## 2017-11-21 NOTE — ED Triage Notes (Signed)
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  Pt reports pain radiates up his back, denies any bowel/bladder incontinence or numbness.

## 2017-11-21 NOTE — ED Notes (Signed)
Pt verbalized understanding discharge instructions and denies any further needs or questions at this time. VS stable, ambulatory and steady gait.   

## 2017-11-22 NOTE — ED Provider Notes (Signed)
[facility] Provider Note   CSN: [id number] Arrival date & time: 11/21/17  2056     History   Chief Complaint Chief Complaint  Patient presents with  . Back Pain    HPI KYSEAN SWEET is a 22 y.o. male.  The history is provided by the patient. No language interpreter was used.  Back Pain   This is a new problem. The current episode started yesterday. The problem has been rapidly worsening. The pain is associated with lifting heavy objects. The pain is present in the lumbar spine. The pain is moderate. He has tried nothing for the symptoms. The treatment provided no relief.   Pt reports he lifted heavy objects at work.  Pt reports back became sore.  Pt reports pain with movement.  Past Medical History:  Diagnosis Date  . Meniscus tear   . Migraines     There are no active problems to display for this patient.   Past Surgical History:  Procedure Laterality Date  . FRACTURE SURGERY Right    thumb        Home Medications    Prior to Admission medications   Medication Sig Start Date End Date Taking? Authorizing Provider  cyclobenzaprine (FLEXERIL) 10 MG tablet Take 1 tablet (10 mg total) by mouth 2 (two) times daily as needed for muscle spasms. 11/21/17   Fransico Meadow, PA-C  ibuprofen (ADVIL,MOTRIN) 600 MG tablet Take 1 tablet (600 mg total) by mouth every 6 (six) hours as needed. 11/21/17   Fransico Meadow, PA-C    Family History Family History  Problem Relation Age of Onset  . Asthma Mother   . Diabetes Other   . Hypertension Other   . CAD Other     Social History Social History   Tobacco Use  . Smoking status: Never Smoker  . Smokeless tobacco: Never Used  Substance Use Topics  . Alcohol use: Yes    Comment: Twice a month  . Drug use: No     Allergies   Patient has no known allergies.   Review of Systems Review of Systems  Musculoskeletal: Positive for back pain.  All other systems reviewed and are  negative.    Physical Exam Updated Vital Signs BP (!) 142/84   Pulse 71   Temp 98.2 F (36.8 C) (Oral)   Resp 15   Ht 6\' 3"  (1.905 m)   Wt (!) 138.3 kg (305 lb)   SpO2 96%   BMI 38.12 kg/m   Physical Exam  Constitutional: He appears well-developed and well-nourished.  HENT:  Head: Normocephalic and atraumatic.  Right Ear: External ear normal.  Left Ear: External ear normal.  Eyes: Conjunctivae are normal.  Neck: Neck supple.  Cardiovascular: Normal rate and regular rhythm.  No murmur heard. Pulmonary/Chest: Effort normal and breath sounds normal. No respiratory distress.  Abdominal: Soft. There is no tenderness.  Musculoskeletal: He exhibits no edema.  Tender low back diffusely pain with movement  nv and ns intact,    Neurological: He is alert.  Skin: Skin is warm and dry.  Psychiatric: He has a normal mood and affect.  Nursing note and vitals reviewed.    ED Treatments / Results  Labs (all labs ordered are listed, but only abnormal results are displayed) Labs Reviewed - No data to display  EKG None  Radiology No results found.  Procedures Procedures (including critical care time)  Medications Ordered in ED Medications  ibuprofen (ADVIL,MOTRIN) tablet 800  mg (800 mg Oral Given 11/21/17 2140)  cyclobenzaprine (FLEXERIL) tablet 10 mg (10 mg Oral Given 11/21/17 2140)     Initial Impression / Assessment and Plan / ED Course  I have reviewed the triage vital signs and the nursing notes.  Pertinent labs & imaging results that were available during my care of the patient were reviewed by me and considered in my medical decision making (see chart for details).     Pt advised limit lifting.    Final Clinical Impressions(s) / ED Diagnoses   Final diagnoses:  Acute low back pain without sciatica, unspecified back pain laterality    ED Discharge Orders        Ordered    ibuprofen (ADVIL,MOTRIN) 600 MG tablet  Every 6 hours PRN     11/21/17 2137     cyclobenzaprine (FLEXERIL) 10 MG tablet  2 times daily PRN     11/21/17 2137    An After Visit Summary was printed and given to the patient.    Fransico Meadow, [place] 11/22/17 1413    [place], Fredia Sorrow, MD 11/25/17 2330

## 2018-03-01 ENCOUNTER — Encounter (HOSPITAL_COMMUNITY): Payer: Self-pay | Admitting: Emergency Medicine

## 2018-03-01 ENCOUNTER — Other Ambulatory Visit: Payer: Self-pay

## 2018-03-01 ENCOUNTER — Emergency Department (HOSPITAL_COMMUNITY)
Admission: EM | Admit: 2018-03-01 | Discharge: 2018-03-01 | Disposition: A | Discharge status: Home / Self Care | Payer: Self-pay | Attending: Emergency Medicine | Admitting: Emergency Medicine

## 2018-03-01 DIAGNOSIS — L0291 Cutaneous abscess, unspecified: Secondary | ICD-10-CM

## 2018-03-01 DIAGNOSIS — L02411 Cutaneous abscess of right axilla: Secondary | ICD-10-CM | POA: Insufficient documentation

## 2018-03-01 DIAGNOSIS — Z79899 Other long term (current) drug therapy: Secondary | ICD-10-CM | POA: Insufficient documentation

## 2018-03-01 MED ORDER — SULFAMETHOXAZOLE-TRIMETHOPRIM 800-160 MG PO TABS: 1.0000 | ORAL_TABLET | Freq: Two times a day (BID) | ORAL | 0 refills | Status: AC

## 2018-03-01 MED ORDER — LIDOCAINE HCL (PF) 1 % IJ SOLN
5.0000 mL | Freq: Once | INTRAMUSCULAR | Status: AC
  Administered 2018-03-01: 5 mL
  Filled 2018-03-01: qty 5

## 2018-03-01 NOTE — ED Notes (Signed)
ED Provider at bedside. 

## 2018-03-01 NOTE — ED Triage Notes (Signed)
Pt has an abscess to right underarm. States he has had this before and has had it drained. States he has had this one for 3-4 days. Afebrile, normal HR.

## 2018-03-01 NOTE — ED Provider Notes (Signed)
[facility] Provider Note   CSN: [id number] Arrival date & time: 03/01/18  1147     History   Chief Complaint Chief Complaint  Patient presents with  . Abscess    HPI Bradley Larson is a 22 y.o. male.  22 year old male presents with complaint of abscess in the right axillary area x2 to 3 days.  Patient reports history of same previously, denies history of hidradenitis.  Denies drainage from the area.  No other complaints or concerns.     Past Medical History:  Diagnosis Date  . Meniscus tear   . Migraines     There are no active problems to display for this patient.   Past Surgical History:  Procedure Laterality Date  . FRACTURE SURGERY Right    thumb        Home Medications    Prior to Admission medications   Medication Sig Start Date End Date Taking? Authorizing Provider  cyclobenzaprine (FLEXERIL) 10 MG tablet Take 1 tablet (10 mg total) by mouth 2 (two) times daily as needed for muscle spasms. 11/21/17   Fransico Meadow, PA-C  ibuprofen (ADVIL,MOTRIN) 600 MG tablet Take 1 tablet (600 mg total) by mouth every 6 (six) hours as needed. 11/21/17   Fransico Meadow, PA-C  sulfamethoxazole-trimethoprim (BACTRIM DS,SEPTRA DS) 800-160 MG tablet Take 1 tablet by mouth 2 (two) times daily for 10 days. 03/01/18 03/11/18  Tacy Learn, PA-C    Family History Family History  Problem Relation Age of Onset  . Asthma Mother   . Diabetes Other   . Hypertension Other   . CAD Other     Social History Social History   Tobacco Use  . Smoking status: Never Smoker  . Smokeless tobacco: Never Used  Substance Use Topics  . Alcohol use: Yes    Comment: Twice a month  . Drug use: No     Allergies   Patient has no known allergies.   Review of Systems Review of Systems  Constitutional: Negative for fever.  Musculoskeletal: Negative for arthralgias and myalgias.  Skin: Positive for color change. Negative for wound.    Neurological: Negative for weakness.  Hematological: Does not bruise/bleed easily.  Psychiatric/Behavioral: Negative for confusion.  All other systems reviewed and are negative.    Physical Exam Updated Vital Signs BP (!) 146/92 (BP Location: Left Arm)   Pulse 78   Temp 98.2 F (36.8 C) (Oral)   Resp 16   SpO2 99%   Physical Exam  Constitutional: He is oriented to person, place, and time. He appears well-developed and well-nourished. No distress.  HENT:  Head: Normocephalic and atraumatic.  Pulmonary/Chest: Effort normal.  Musculoskeletal: He exhibits tenderness. He exhibits no deformity.  Neurological: He is alert and oriented to person, place, and time.  Skin: Skin is warm and dry. He is not diaphoretic. There is erythema.     Psychiatric: He has a normal mood and affect. His behavior is normal.  Nursing note and vitals reviewed.    ED Treatments / Results  Labs (all labs ordered are listed, but only abnormal results are displayed) Labs Reviewed - No data to display  EKG None  Radiology No results found.  Procedures .Marland KitchenIncision and Drainage Date/Time: 03/01/2018 2:30 PM Performed by: Tacy Learn, PA-C Authorized by: Tacy Learn, PA-C   Consent:    Consent obtained:  Verbal   Consent given by:  Patient   Risks discussed:  Bleeding, incomplete drainage, pain  and damage to other organs   Alternatives discussed:  No treatment Universal protocol:    Procedure explained and questions answered to patient or proxy's satisfaction: yes     Relevant documents present and verified: yes     Test results available and properly labeled: yes     Imaging studies available: yes     Required blood products, implants, devices, and special equipment available: yes     Site/side marked: yes     Immediately prior to procedure a time out was called: yes     Patient identity confirmed:  Verbally with patient Location:    Type:  Abscess   Size:  2 cm x 3 cm    Location:  Upper extremity   Upper extremity location: Axilla. Pre-procedure details:    Skin preparation:  Betadine Anesthesia (see MAR for exact dosages):    Anesthesia method:  Local infiltration   Local anesthetic:  Lidocaine 1% w/o epi Procedure type:    Complexity:  Complex Procedure details:    Incision types:  Single straight   Incision depth:  Subcutaneous   Scalpel blade:  11   Wound management:  Probed and deloculated, irrigated with saline and extensive cleaning   Drainage:  Purulent   Drainage amount:  Moderate   Wound treatment:  Wound left open   Packing materials:  1/2 in iodoform gauze   Amount 1/2" iodoform:  2" Post-procedure details:    Patient tolerance of procedure:  Tolerated well, no immediate complications   (including critical care time)  Medications Ordered in ED Medications  lidocaine (PF) (XYLOCAINE) 1 % injection 5 mL (5 mLs Infiltration Given 03/01/18 1347)     Initial Impression / Assessment and Plan / ED Course  I have reviewed the triage vital signs and the nursing notes.  Pertinent labs & imaging results that were available during my care of the patient were reviewed by me and considered in my medical decision making (see chart for details).  Clinical Course as of Mar 01 1430  Sat Mar 01, 2018  1431 I&D to right axillary abscess with packing placed.  Recommend warm compresses, Septra, complete full course.  Packing removal in 2 days.   [LM]    Clinical Course User Index [LM] Tacy Learn, PA-C    Final Clinical Impressions(s) / ED Diagnoses   Final diagnoses:  Abscess    ED Discharge Orders         Ordered    sulfamethoxazole-trimethoprim (BACTRIM DS,SEPTRA DS) 800-160 MG tablet  2 times daily     03/01/18 1405           Tacy Learn, PA-C 03/01/18 1431    Orpah Greek, MD 03/02/18 [phone]

## 2018-03-01 NOTE — Discharge Instructions (Addendum)
Warm compresses for 20 minutes at a time. Take Septra as prescribed and complete the full course. Remove packing in 2 days.

## 2019-02-13 ENCOUNTER — Encounter (HOSPITAL_COMMUNITY): Payer: Self-pay | Admitting: Emergency Medicine

## 2019-02-13 ENCOUNTER — Emergency Department (HOSPITAL_COMMUNITY): Payer: Self-pay

## 2019-02-13 ENCOUNTER — Other Ambulatory Visit: Payer: Self-pay

## 2019-02-13 ENCOUNTER — Emergency Department (HOSPITAL_COMMUNITY)
Admission: EM | Admit: 2019-02-13 | Discharge: 2019-02-13 | Disposition: A | Discharge status: Home / Self Care | Payer: Self-pay | Attending: Emergency Medicine | Admitting: Emergency Medicine

## 2019-02-13 DIAGNOSIS — Y999 Unspecified external cause status: Secondary | ICD-10-CM | POA: Insufficient documentation

## 2019-02-13 DIAGNOSIS — Y92310 Basketball court as the place of occurrence of the external cause: Secondary | ICD-10-CM | POA: Insufficient documentation

## 2019-02-13 DIAGNOSIS — S93401A Sprain of unspecified ligament of right ankle, initial encounter: Secondary | ICD-10-CM | POA: Insufficient documentation

## 2019-02-13 DIAGNOSIS — X500XXA Overexertion from strenuous movement or load, initial encounter: Secondary | ICD-10-CM | POA: Insufficient documentation

## 2019-02-13 DIAGNOSIS — Y9367 Activity, basketball: Secondary | ICD-10-CM | POA: Insufficient documentation

## 2019-02-13 NOTE — ED Triage Notes (Signed)
Rt ankle pain since injury yesterday play b ball hurts to bear wt

## 2019-02-13 NOTE — Discharge Instructions (Addendum)
Follow up with your doctor in 1 week if not improving. Take Motrin and Tylenol as needed as directed for pain.

## 2019-02-13 NOTE — ED Provider Notes (Signed)
[facility] Provider Note   CSN: [id number] Arrival date & time: 02/13/19  1440     History   Chief Complaint No chief complaint on file.   HPI Bradley Larson is a 23 y.o. male.     23 year old male with complaint of right lateral ankle pain.  Patient was playing basketball yesterday and rolled his ankle on uneven pavement.  Patient reports pain bearing weight yesterday with numbness in his toes, numbness has resolved as of today but continues to have lateral ankle pain.  No other injuries or complaints.     Past Medical History:  Diagnosis Date  . Meniscus tear   . Migraines     There are no active problems to display for this patient.   Past Surgical History:  Procedure Laterality Date  . FRACTURE SURGERY Right    thumb        Home Medications    Prior to Admission medications   Medication Sig Start Date End Date Taking? Authorizing Provider  cyclobenzaprine (FLEXERIL) 10 MG tablet Take 1 tablet (10 mg total) by mouth 2 (two) times daily as needed for muscle spasms. 11/21/17   Fransico Meadow, PA-C  ibuprofen (ADVIL,MOTRIN) 600 MG tablet Take 1 tablet (600 mg total) by mouth every 6 (six) hours as needed. 11/21/17   Fransico Meadow, PA-C    Family History Family History  Problem Relation Age of Onset  . Asthma Mother   . Diabetes Other   . Hypertension Other   . CAD Other     Social History Social History   Tobacco Use  . Smoking status: Never Smoker  . Smokeless tobacco: Never Used  Substance Use Topics  . Alcohol use: Yes    Comment: Twice a month  . Drug use: No     Allergies   Patient has no known allergies.   Review of Systems Review of Systems  Constitutional: Negative for fever.  Musculoskeletal: Positive for arthralgias, joint swelling and myalgias.  Skin: Negative for color change, rash and wound.  Allergic/Immunologic: Negative for immunocompromised state.  Neurological: Negative for  weakness and numbness.  Hematological: Does not bruise/bleed easily.  Psychiatric/Behavioral: Negative for self-injury.  All other systems reviewed and are negative.    Physical Exam Updated Vital Signs BP 128/81 (BP Location: Left Arm)   Pulse 98   Temp 98.5 F (36.9 C) (Oral)   Resp 18   SpO2 98%   Physical Exam Vitals signs and nursing note reviewed.  Constitutional:      General: He is not in acute distress.    Appearance: He is well-developed. He is not diaphoretic.  HENT:     Head: Normocephalic and atraumatic.  Cardiovascular:     Pulses: Normal pulses.  Pulmonary:     Effort: Pulmonary effort is normal.  Musculoskeletal:        General: Swelling and tenderness present. No deformity.     Right knee: Normal.     Right ankle: He exhibits decreased range of motion and swelling. He exhibits no ecchymosis, no deformity, no laceration and normal pulse. Tenderness. Lateral malleolus tenderness found. No medial malleolus, no head of 5th metatarsal and no proximal fibula tenderness found.     Right foot: Normal.  Skin:    General: Skin is warm and dry.     Capillary Refill: Capillary refill takes less than 2 seconds.     Findings: No erythema or rash.  Neurological:  Mental Status: He is alert and oriented to person, place, and time.  Psychiatric:        Behavior: Behavior normal.      ED Treatments / Results  Labs (all labs ordered are listed, but only abnormal results are displayed) Labs Reviewed - No data to display  EKG None  Radiology Dg Ankle Complete Right  Result Date: 02/13/2019 CLINICAL DATA:  Injury to lateral ankle. EXAM: RIGHT ANKLE - COMPLETE 3+ VIEW COMPARISON:  Nov 05, 2015 FINDINGS: There is soft tissue swelling about the ankle without evidence for an acute displaced fracture or dislocation. There are no significant degenerative changes. IMPRESSION: No acute osseous abnormality. Electronically Signed   By: Constance Holster M.D.   On:  02/13/2019 15:34    Procedures Procedures (including critical care time)  Medications Ordered in ED Medications - No data to display   Initial Impression / Assessment and Plan / ED Course  I have reviewed the triage vital signs and the nursing notes.  Pertinent labs & imaging results that were available during my care of the patient were reviewed by me and considered in my medical decision making (see chart for details).  Clinical Course as of Feb 13 1540  Fri Feb 12, 6722  3272 23 year old male with right lateral ankle pain after rolling his ankle playing basketball yesterday when he stepped on uneven pavement.  On exam patient is mild swelling and tenderness to the lateral ankle, no pain at the proximal fibula or fifth metatarsal.  DP pulses intact and symmetric, sensation intact.  Ankle x-ray is negative for fracture.  Patient states he has crutches at home to use to weight-bear as tolerated.  Plan is to discharge an ankle ASO splint, recommend ice and elevate, take Motrin Tylenol as needed as directed.  Recheck with PCP in 1 week if pain persists or is not improving.   [LM]    Clinical Course User Index [LM] Tacy Learn, PA-C      Final Clinical Impressions(s) / ED Diagnoses   Final diagnoses:  Sprain of right ankle, unspecified ligament, initial encounter    ED Discharge Orders    None       Tacy Learn, PA-C 02/13/19 1541    Sherwood Gambler, MD 02/13/19 1756

## 2019-02-13 NOTE — Progress Notes (Signed)
Orthopedic Tech Progress Note Patient Details:  Bradley Larson June 04, 1996 [id number]  Ortho Devices Type of Ortho Device: ASO Ortho Device/Splint Location: LRE Ortho Device/Splint Interventions: Adjustment, Application, Ordered   Post Interventions Patient Tolerated: Well Instructions Provided: Care of device, Adjustment of device   Janit Pagan 02/13/2019, 4:05 PM

## 2019-09-11 ENCOUNTER — Encounter: Payer: Self-pay | Admitting: Emergency Medicine

## 2019-09-11 ENCOUNTER — Ambulatory Visit
Admission: EM | Admit: 2019-09-11 | Discharge: 2019-09-11 | Disposition: A | Discharge status: Home / Self Care | Payer: BC Managed Care – PPO | Attending: Physician Assistant | Admitting: Physician Assistant

## 2019-09-11 ENCOUNTER — Other Ambulatory Visit: Payer: Self-pay

## 2019-09-11 DIAGNOSIS — F411 Generalized anxiety disorder: Secondary | ICD-10-CM | POA: Diagnosis not present

## 2019-09-11 HISTORY — DX: Anxiety disorder, unspecified: F41.9

## 2019-09-11 MED ORDER — HYDROXYZINE HCL 25 MG PO TABS: 25.0000 mg | ORAL_TABLET | Freq: Four times a day (QID) | ORAL | 0 refills | Status: DC | PRN

## 2019-09-11 NOTE — Discharge Instructions (Signed)
You can try hydroxyzine for the next few days. Increase water intake, decrease caffeine intake. Call PCP on Monday to inform of current situation to adjust medicine.

## 2019-09-11 NOTE — ED Triage Notes (Signed)
Patient c/o feeling like he couldn't breath while he was driving home today. He states he was on the phone with his mom and started to hyperventilate. He states he stopped taking his Lexapro about 1 week ago.

## 2019-09-11 NOTE — ED Provider Notes (Signed)
[facility]    CSN: [id number] Arrival date & time: 09/11/19  1747      History   Chief Complaint Chief Complaint  Patient presents with  . Anxiety    HPI Bradley Larson is a 24 y.o. male.   24 year old male comes in for anxiety. He was on Lexapro for anxiety/depression.  States was taking with good relief of symptoms, but had to miss dosage during work as medication made him drowsy.  Due to the side effect, a week ago, he stopped taking his Lexapro completely.  Since then, he has had increasing episodes of anxiety sensation.  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  He is now completely asymptomatic.  He denies any URI symptoms such as cough, congestion, sore throat.  Denies fever, chills, body aches.  Denies chest pain, leg swelling.  Denies abdominal pain.  States he has been drinking energy drinks due to drowsiness from the medication.  Otherwise no changes in diet, no increased stress. Denies SI/HI.      Past Medical History:  Diagnosis Date  . Anxiety   . Meniscus tear   . Migraines     There are no problems to display for this patient.   Past Surgical History:  Procedure Laterality Date  . FRACTURE SURGERY Right    thumb       Home Medications    Prior to Admission medications   Medication Sig Start Date End Date Taking? Authorizing Provider  hydrOXYzine (ATARAX/VISTARIL) 25 MG tablet Take 1 tablet (25 mg total) by mouth every 6 (six) hours as needed for anxiety. 09/11/19   Ok Edwards, PA-C    Family History Family History  Problem Relation Age of Onset  . Asthma Mother   . Diabetes Other   . Hypertension Other   . CAD Other     Social History Social History   Tobacco Use  . Smoking status: Never Smoker  . Smokeless tobacco: Never Used  Substance Use Topics  . Alcohol use: Not Currently    Comment: Twice a month  . Drug use: No     Allergies   Patient  has no known allergies.   Review of Systems Review of Systems  Reason unable to perform ROS: See HPI as above.     Physical Exam Triage Vital Signs ED Triage Vitals  Enc Vitals Group     BP 09/11/19 1757 126/79     Pulse Rate 09/11/19 1757 78     Resp 09/11/19 1757 18     Temp 09/11/19 1757 97.9 F (36.6 C)     Temp Source 09/11/19 1757 Oral     SpO2 09/11/19 1757 97 %     Weight 09/11/19 1758 (!) 310 lb (140.6 kg)     Height 09/11/19 1758 6\' 3"  (1.905 m)     Head Circumference --      Peak Flow --      Pain Score 09/11/19 1758 0     Pain Loc --      Pain Edu? --      Excl. in [place]? --    No data found.  Updated Vital Signs BP 126/79 (BP Location: Right Arm)   Pulse 78   Temp 97.9 F (36.6 C) (Oral)   Resp 18   Ht 6\' 3"  (1.905 m)   Wt (!) 310 lb (140.6 kg)   SpO2 97%  BMI 38.75 kg/m   Physical Exam Constitutional:      General: He is not in acute distress.    Appearance: Normal appearance. He is not ill-appearing, toxic-appearing or diaphoretic.  HENT:     Head: Normocephalic and atraumatic.     Mouth/Throat:     Mouth: Mucous membranes are moist.     Pharynx: Oropharynx is clear. Uvula midline.  Cardiovascular:     Rate and Rhythm: Normal rate and regular rhythm.     Heart sounds: Normal heart sounds. No murmur. No friction rub. No gallop.   Pulmonary:     Effort: Pulmonary effort is normal. No accessory muscle usage, prolonged expiration, respiratory distress or retractions.     Comments: Lungs clear to auscultation without adventitious lung sounds. Musculoskeletal:     Cervical back: Normal range of motion and neck supple.  Neurological:     General: No focal deficit present.     Mental Status: He is alert and oriented to person, place, and time.      UC Treatments / Results  Labs (all labs ordered are listed, but only abnormal results are displayed) Labs Reviewed - No data to display  EKG   Radiology No results  found.  Procedures Procedures (including critical care time)  Medications Ordered in UC Medications - No data to display  Initial Impression / Assessment and Plan / UC Course  I have reviewed the triage vital signs and the nursing notes.  Pertinent labs & imaging results that were available during my care of the patient were reviewed by me and considered in my medical decision making (see chart for details).    No alarming signs on exam.  Patient currently asymptomatic.  He does not work for the next 2 days.  Discussed monitoring, and calling PCP after week in for medication management versus trying hydroxyzine for the next few days.  Patient would like trial of hydroxyzine.  Discussed decreasing caffeine intake, increasing water intake as well.  Return precautions given.  Final Clinical Impressions(s) / UC Diagnoses   Final diagnoses:  Anxiety state    ED Prescriptions    Medication Sig Dispense Auth. Provider   hydrOXYzine (ATARAX/VISTARIL) 25 MG tablet Take 1 tablet (25 mg total) by mouth every 6 (six) hours as needed for anxiety. 12 tablet Ok Edwards, PA-C     PDMP not reviewed this encounter.   Ok Edwards, PA-C 09/12/19 [phone]

## 2019-10-06 ENCOUNTER — Encounter: Payer: Self-pay | Admitting: Neurology

## 2019-10-06 ENCOUNTER — Other Ambulatory Visit: Payer: Self-pay

## 2019-10-06 ENCOUNTER — Ambulatory Visit: Payer: BC Managed Care – PPO | Admitting: Neurology

## 2019-10-06 VITALS — BP 126/74 | HR 91 | Temp 97.6°F | Ht 75.0 in | Wt 314.3 lb

## 2019-10-06 DIAGNOSIS — R0681 Apnea, not elsewhere classified: Secondary | ICD-10-CM | POA: Diagnosis not present

## 2019-10-06 DIAGNOSIS — E669 Obesity, unspecified: Secondary | ICD-10-CM

## 2019-10-06 DIAGNOSIS — R519 Headache, unspecified: Secondary | ICD-10-CM

## 2019-10-06 DIAGNOSIS — Z9189 Other specified personal risk factors, not elsewhere classified: Secondary | ICD-10-CM

## 2019-10-06 DIAGNOSIS — G4719 Other hypersomnia: Secondary | ICD-10-CM

## 2019-10-06 DIAGNOSIS — R0683 Snoring: Secondary | ICD-10-CM | POA: Diagnosis not present

## 2019-10-06 DIAGNOSIS — J351 Hypertrophy of tonsils: Secondary | ICD-10-CM

## 2019-10-06 NOTE — Progress Notes (Signed)
Subjective:    Patient ID: CODEN Keewatin is a 24 y.o. male.  HPI     Star Age, MD, PhD [facility] [address], [address].O. [place], [place] [address]  Dear Kieth Brightly,   I saw your patient, Weslie Henault, upon your kind request, in my Sleep clinic today for initial consultation of his sleep disorder, in particular, concern for underlying obstructive sleep apnea.  The patient is unaccompanied today.  As you know, Mr. Martines is a 24 year old right-handed gentleman with an underlying medical history of anxiety, depression, migraine headaches, and obesity, who reports snoring and excessive daytime somnolence.  In the past 6 to 8 months he has had difficulty initiating and maintaining sleep.  He has had a decrease in his anxiety.  He has had increase in his migraines and has woken up occasionally with a headache.  His wife has noted pauses in his breathing.  He does not achieve restful sleep and his Epworth sleepiness score is 14 out of 24, fatigue severity score is 46 out of 63.  He is not aware of any family history of OSA.  He tries to be in bed between 9 and 10 but may not be asleep until a few hours later.  He was on Lexapro in the recent past but had side effects including sleepiness from it.  More recently, he has been started on hydroxyzine as needed.  His rise time is around 930 but he is often awake much earlier than that.  He works for [employer] as a Education officer, community.  He works typically from 10 AM to as late as 52 PM at times.  He has had weight gain in the recent past.  Sometimes he wakes up and his throat and uvula feels swollen.  He is a non-smoker, smoked a little bit when he was a teenager, he drinks very little caffeine, none daily currently and eliminated drinking energy drinks.  He drinks alcohol rarely.  He lives with his wife and 36-year-old daughter.  They have a small dog in the household, the dog does not sleep in the bedroom with them.  They do have a  TV in the bedroom but he does not have to have it on at night and tries to turn it off before falling asleep. I reviewed your office note from 08/07/2019.    His Past Medical History Is Significant For: Past Medical History:  Diagnosis Date  . Anxiety   . Meniscus tear   . Migraines     His Past Surgical History Is Significant For: Past Surgical History:  Procedure Laterality Date  . FRACTURE SURGERY Right    thumb    His Family History Is Significant For: Family History  Problem Relation Age of Onset  . Asthma Mother   . Diabetes Other   . Hypertension Other   . CAD Other     His Social History Is Significant For: Social History   Socioeconomic History  . Marital status: Married    Spouse name: Not on file  . Number of children: Not on file  . Years of education: Not on file  . Highest education level: Not on file  Occupational History  . Not on file  Tobacco Use  . Smoking status: Never Smoker  . Smokeless tobacco: Never Used  Substance and Sexual Activity  . Alcohol use: Not Currently    Comment: Twice a month  . Drug use: No  . Sexual activity: Never  Other  Topics Concern  . Not on file  Social History Narrative  . Not on file   Social Determinants of Health   Financial Resource Strain:   . Difficulty of Paying Living Expenses:   Food Insecurity:   . Worried About Charity fundraiser in the Last Year:   . Arboriculturist in the Last Year:   Transportation Needs:   . Film/video editor (Medical):   Marland Kitchen Lack of Transportation (Non-Medical):   Physical Activity:   . Days of Exercise per Week:   . Minutes of Exercise per Session:   Stress:   . Feeling of Stress :   Social Connections:   . Frequency of Communication with Friends and Family:   . Frequency of Social Gatherings with Friends and Family:   . Attends Religious Services:   . Active Member of Clubs or Organizations:   . Attends Archivist Meetings:   Marland Kitchen Marital Status:      His Allergies Are:  No Known Allergies:   His Current Medications Are:  Outpatient Encounter Medications as of 10/06/2019  Medication Sig  . hydrOXYzine (ATARAX/VISTARIL) 25 MG tablet Take 1 tablet (25 mg total) by mouth every 6 (six) hours as needed for anxiety.   No facility-administered encounter medications on file as of 10/06/2019.  :  Review of Systems:  Out of a complete 14 point review of systems, all are reviewed and negative with the exception of these symptoms as listed below:  Review of Systems  Neurological:       Here for sleep consult. No prior sleep study. Pt does report snoring and daytime sleepiness/fatigue.   Epworth Sleepiness Scale 0= would never doze 1= slight chance of dozing 2= moderate chance of dozing 3= high chance of dozing  Sitting and reading:2 Watching TV:3 Sitting inactive in a public place (ex. Theater or meeting):2 As a passenger in a car for an hour without a break:3 Lying down to rest in the afternoon:0 Sitting and talking to someone:1 Sitting quietly after lunch (no alcohol):0 In a car, while stopped in traffic:1 Total:14     Objective:  Neurological Exam  Physical Exam Physical Examination:   Vitals:   10/06/19 1523  BP: 126/74  Pulse: 91  Temp: 97.6 F (36.4 C)    General Examination: The patient is a very pleasant 24 y.o. male in no acute distress. He appears well-developed and well-nourished and well groomed.   HEENT: Normocephalic, atraumatic, pupils are equal, round and reactive to light, extraocular tracking is good without limitation to gaze excursion or nystagmus noted. Hearing is grossly intact. Face is symmetric with normal facial animation. Speech is clear with no dysarthria noted. There is no hypophonia. There is no lip, neck/head, jaw or voice tremor. Neck is supple with full range of passive and active motion. There are no carotid bruits on auscultation. Oropharynx exam reveals: mild mouth dryness, good dental  hygiene and marked airway crowding, due to tonsillar size of about 3+, larger uvula, small airway entry, wider tongue, Mallampati class II.  Neck circumference of 17-1/2 inches.  He does not have much in the way of overbite.  Tongue protrudes centrally in palate elevates symmetrically.  Chest: Clear to auscultation without wheezing, rhonchi or crackles noted.  Heart: S1+S2+0, regular and normal without murmurs, rubs or gallops noted.   Abdomen: Soft, non-tender and non-distended with normal bowel sounds appreciated on auscultation.  Extremities: There is no pitting edema in the distal lower extremities bilaterally.  Skin: Warm and dry without trophic changes noted.   Musculoskeletal: exam reveals no obvious joint deformities, tenderness or joint swelling or erythema.   Neurologically:  Mental status: The patient is awake, alert and oriented in all 4 spheres. His immediate and remote memory, attention, language skills and fund of knowledge are appropriate. There is no evidence of aphasia, agnosia, apraxia or anomia. Speech is clear with normal prosody and enunciation. Thought process is linear. Mood is normal and affect is normal.  Cranial nerves II - XII are as described above under HEENT exam.  Motor exam: Normal bulk, strength and tone is noted. There is no tremor, Romberg is negative. Fine motor skills and coordination: grossly intact.  Cerebellar testing: No dysmetria or intention tremor. There is no truncal or gait ataxia.  Sensory exam: intact to light touch in the upper and lower extremities.  Gait, station and balance: He stands easily. No veering to one side is noted. No leaning to one side is noted. Posture is age-appropriate and stance is narrow based. Gait shows normal stride length and normal pace. No problems turning are noted. Tandem walk is unremarkable.                Assessment and Plan:   In summary, Marios Yellowhair Spigelmyer is a very pleasant 24 y.o.-year old male with an  underlying medical history of anxiety, depression, migraine headaches, and obesity, whose history and physical exam are concerning for obstructive sleep apnea (OSA). I had a long chat with the patient about my findings and the diagnosis of OSA, its prognosis and treatment options. We talked about medical treatments, surgical interventions and non-pharmacological approaches. I explained in particular the risks and ramifications of untreated moderate to severe OSA, especially with respect to developing cardiovascular disease down the Road, including congestive heart failure, difficult to treat hypertension, cardiac arrhythmias, or stroke. Even type 2 diabetes has, in part, been linked to untreated OSA. Symptoms of untreated OSA include daytime sleepiness, memory problems, mood irritability and mood disorder such as depression and anxiety, lack of energy, as well as recurrent headaches, especially morning headaches. We talked about trying to maintain a healthy lifestyle in general, as well as the importance of weight control. We also talked about the importance of good sleep hygiene. I recommended the following at this time: sleep study.  I explained the sleep test procedure to the patient and also outlined possible surgical and non-surgical treatment options of OSA, including the use of a custom-made dental device (which would require a referral to a specialist dentist or oral surgeon), upper airway surgical options, such as traditional UPPP or a novel less invasive surgical option in the form of Inspire hypoglossal nerve stimulation (which would involve a referral to an ENT surgeon). I also explained the CPAP treatment option to the patient, who indicated that he would be willing to try CPAP if the need arises. I explained the importance of being compliant with PAP treatment, not only for insurance purposes but primarily to improve His symptoms, and for the patient's long term health benefit, including to reduce  His cardiovascular risks. I answered all his questions today and the patient was in agreement. I plan to see him back after the sleep study is completed and encouraged him to call with any interim questions, concerns, problems or updates.   Thank you very much for allowing me to participate in the care of this nice patient. If I can be of any further assistance to you please do  not hesitate to call me at [phone].  Sincerely,   Star Age, MD, PhD

## 2019-10-06 NOTE — Patient Instructions (Signed)

## 2019-10-28 ENCOUNTER — Ambulatory Visit (INDEPENDENT_AMBULATORY_CARE_PROVIDER_SITE_OTHER): Payer: BC Managed Care – PPO | Admitting: Neurology

## 2019-10-28 DIAGNOSIS — G4733 Obstructive sleep apnea (adult) (pediatric): Secondary | ICD-10-CM | POA: Diagnosis not present

## 2019-10-28 DIAGNOSIS — G4719 Other hypersomnia: Secondary | ICD-10-CM

## 2019-10-28 DIAGNOSIS — J351 Hypertrophy of tonsils: Secondary | ICD-10-CM

## 2019-10-28 DIAGNOSIS — R0683 Snoring: Secondary | ICD-10-CM

## 2019-10-28 DIAGNOSIS — Z9189 Other specified personal risk factors, not elsewhere classified: Secondary | ICD-10-CM

## 2019-10-28 DIAGNOSIS — R0681 Apnea, not elsewhere classified: Secondary | ICD-10-CM

## 2019-10-28 DIAGNOSIS — E669 Obesity, unspecified: Secondary | ICD-10-CM

## 2019-10-28 DIAGNOSIS — R519 Headache, unspecified: Secondary | ICD-10-CM

## 2019-11-04 ENCOUNTER — Telehealth: Payer: Self-pay

## 2019-11-04 NOTE — Procedures (Signed)
Patient Information     First Name: Bradley Last Name: Larson ID: [id number]  Birth Date: 1995-09-17 Age: 24 Gender: Male  Referring Provider: Berkley Harvey, NP BMI: 39.6 (W=315 lb, H=6' 3'')  Neck Circ.:  58 '' Epworth:  14/24   Sleep Study Information    Study Date: Oct 28, 2019 S/H/A Version: 001.001.001.001 / 4.1.1528 / 54  History:    24 year old man with a history of anxiety, depression, migraine headaches, and obesity, who reports snoring and excessive daytime somnolence.  Summary & Diagnosis:     Mild OSA Recommendations:     This home sleep test demonstrates mild obstructive sleep apnea with a total AHI of 6.8/hour and O2 nadir of 90%. Given the patient's medical history and sleep related complaints, treatment with positive airway pressure is a reasonable choice. This can be achieved in the form of autoPAP trial/titration at home. A full night CPAP titration study will help with proper treatment settings and mask fitting if needed. Alternative treatments include weight loss along with avoidance of the supine sleep position, or an oral appliance in appropriate candidates.   Please note that untreated obstructive sleep apnea may carry additional perioperative morbidity. Patients with significant obstructive sleep apnea should receive perioperative PAP therapy and the surgeons and particularly the anesthesiologist should be informed of the diagnosis and the severity of the sleep disordered breathing. The patient should be cautioned not to drive, work at heights, or operate dangerous or heavy equipment when tired or sleepy. Review and reiteration of good sleep hygiene measures should be pursued with any patient. Other causes of the patient's symptoms, including circadian rhythm disturbances, an underlying mood disorder, medication effect and/or an underlying medical problem cannot be ruled out based on this test. Clinical correlation is recommended.  The patient and his referring provider  will be notified of the test results. The patient will be seen in follow up in sleep clinic at [facility].  I certify that I have reviewed the raw data recording prior to the issuance of this report in accordance with the standards of the American Academy of Sleep Medicine (AASM).  Star Age, MD, PhD [facility]) Diplomat, ABPN (Neurology and Sleep)            Sleep Summary  Oxygen Saturation Statistics   Start Study Time: End Study Time: Total Recording Time:          11:16:10 PM 6:42:59 AM   7 h, 26 min  Total Sleep Time % REM of Sleep Time:  6 h, 35 min  22.4    Mean: 94 Minimum: 90 Maximum: 98  Mean of Desaturations Nadirs (%):   92  Oxygen Desaturation. %: 4-9 10-20 >20 Total  Events Number Total  9 100.0  0 0.0  0 0.0  9 100.0  Oxygen Saturation: <90 <=88 <85 <80 <70  Duration (minutes): Sleep % 0.0 0.0 0.0 0.0 0.0 0.0 0.0 0.0 0.0 0.0     Respiratory Indices      Total Events REM NREM All Night  pRDI:  48  pAHI:  39 ODI:  9  pAHIc:  4  % CSR: 0.0 15.7 12.2 1.8 0.9 6.6 5.5 1.5 0.8 8.4 6.8 1.6 0.8       Pulse Rate Statistics during Sleep (BPM)      Mean: 71 Minimum: 47 Maximum: 106    Indices are calculated using technically valid sleep time of 5 h, 43 min. Central-Indices are calculated using  technically valid sleep time of 5 h, 4 min. pRDI/pAHI are calculated using oxi desaturations ? 3%  Body Position Statistics  Position Supine Prone Right Left Non-Supine  Sleep (min) 89.0 139.5 2.0 165.3 306.8  Sleep % 22.5 35.2 0.5 41.8 77.5  pRDI 16.8 7.1 N/A 4.0 5.7  pAHI 15.3 4.7 N/A 3.6 4.1  ODI 4.4 1.4 N/A 0.0 0.7     Snoring Statistics Snoring Level (dB) >40 >50 >60 >70 >80 >Threshold (45)  Sleep (min) 74.4 2.2 0.7 0.0 0.0 6.2  Sleep % 18.8 0.6 0.2 0.0 0.0 1.6    Mean: 41 dB Sleep Stages Chart

## 2019-11-04 NOTE — Addendum Note (Signed)
Addended by: Star Age on: 11/04/2019 08:57 AM   Modules accepted: Orders

## 2019-11-04 NOTE — Progress Notes (Signed)
Patient referred by [name], NP, seen by me on 10/06/19, HST on 10/28/19.  Please call and notify the patient that the recent home sleep test showed obstructive sleep apnea. OSA is overall mild, but worth treating to see if he feels better after treatment, especially, with regards to headaches and daytime energy. To that end, I recommend treatment for this in the form of autoPAP, which means, that we don't have to bring him in for a sleep study with CPAP, but will let him try an autoPAP machine at home, through a DME company (of his choice, or as per insurance requirement). The DME representative will educate him on how to use the machine, how to put the mask on, etc. I have placed an order in the chart. Please send referral, talk to patient, send report to referring MD. We will need a FU in sleep clinic for 10 weeks post-PAP set up, please arrange that with me or one of our NPs. Thanks,   Star Age, MD, PhD [facility])

## 2019-11-04 NOTE — Telephone Encounter (Signed)
-----   Message from Star Age, MD sent at 11/04/2019  8:57 AM EDT ----- Patient referred by [name], NP, seen by me on 10/06/19, HST on 10/28/19.  Please call and notify the patient that the recent home sleep test showed obstructive sleep apnea. OSA is overall mild, but worth treating to see if he feels better after treatment, especially, with regards to headaches and daytime energy. To that end, I recommend treatment for this in the form of autoPAP, which means, that we don't have to bring him in for a sleep study with CPAP, but will let him try an autoPAP machine at home, through a DME company (of his choice, or as per insurance requirement). The DME representative will educate him on how to use the machine, how to put the mask on, etc. I have placed an order in the chart. Please send referral, talk to patient, send report to referring MD. We will need a FU in sleep clinic for 10 weeks post-PAP set up, please arrange that with me or one of our NPs. Thanks,   Star Age, MD, PhD [facility])

## 2019-11-04 NOTE — Telephone Encounter (Signed)
I called pt. No answer, left a message asking pt to call me back.   

## 2019-11-04 NOTE — Telephone Encounter (Signed)
Pt returned call. Please call back when available. 

## 2019-11-05 NOTE — Telephone Encounter (Signed)
I have reached out to the pt and we reviewed the results. Pt would like to take the weekend to think over the result and then call me back on 11/10/2019 to give me his decision.

## 2019-11-10 ENCOUNTER — Encounter: Payer: Self-pay | Admitting: Emergency Medicine

## 2019-11-10 ENCOUNTER — Ambulatory Visit (INDEPENDENT_AMBULATORY_CARE_PROVIDER_SITE_OTHER): Payer: BC Managed Care – PPO

## 2019-11-10 ENCOUNTER — Other Ambulatory Visit: Payer: Self-pay

## 2019-11-10 ENCOUNTER — Ambulatory Visit
Admission: EM | Admit: 2019-11-10 | Discharge: 2019-11-10 | Disposition: A | Discharge status: Home / Self Care | Payer: BC Managed Care – PPO | Attending: Emergency Medicine | Admitting: Emergency Medicine

## 2019-11-10 DIAGNOSIS — S99921A Unspecified injury of right foot, initial encounter: Secondary | ICD-10-CM

## 2019-11-10 DIAGNOSIS — W1842XA Slipping, tripping and stumbling without falling due to stepping into hole or opening, initial encounter: Secondary | ICD-10-CM

## 2019-11-10 DIAGNOSIS — M79671 Pain in right foot: Secondary | ICD-10-CM

## 2019-11-10 NOTE — ED Triage Notes (Signed)
Pt here for right ankle pain after twisting ankle today at work; pt had ASO splint at the time

## 2019-11-10 NOTE — Discharge Instructions (Signed)

## 2019-11-10 NOTE — ED Provider Notes (Signed)
[facility]    CSN: [id number] Arrival date & time: 11/10/19  [place]      History   Chief Complaint Chief Complaint  Patient presents with  . Ankle Pain    HPI Bradley Larson is a 24 y.o. male with history of migraines presenting for right foot pain and swelling s/p inversion injury at work today.  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  Denies head trauma, LOC.  Patient has ASO brace at home: Applied this with some relief.  Patient is able to bear weight, though endorsing pain when doing so.  Denies deformity, numbness.   Past Medical History:  Diagnosis Date  . Anxiety   . Meniscus tear   . Migraines     There are no problems to display for this patient.   Past Surgical History:  Procedure Laterality Date  . FRACTURE SURGERY Right    thumb       Home Medications    Prior to Admission medications   Medication Sig Start Date End Date Taking? Authorizing Provider  hydrOXYzine (ATARAX/VISTARIL) 25 MG tablet Take 1 tablet (25 mg total) by mouth every 6 (six) hours as needed for anxiety. 09/11/19   Ok Edwards, PA-C    Family History Family History  Problem Relation Age of Onset  . Asthma Mother   . Diabetes Other   . Hypertension Other   . CAD Other     Social History Social History   Tobacco Use  . Smoking status: Never Smoker  . Smokeless tobacco: Never Used  Substance Use Topics  . Alcohol use: Not Currently    Comment: Twice a month  . Drug use: No     Allergies   Patient has no known allergies.   Review of Systems As per HPI   Physical Exam Triage Vital Signs ED Triage Vitals  Enc Vitals Group     BP      Pulse      Resp      Temp      Temp src      SpO2      Weight      Height      Head Circumference      Peak Flow      Pain Score      Pain Loc      Pain Edu?      Excl. in [place]?    No data found.  Updated Vital Signs BP 137/82 (BP Location: Right Arm)   Pulse 90   Temp  98.3 F (36.8 C) (Oral)   Resp 18   SpO2 95%   Visual Acuity Right Eye Distance:   Left Eye Distance:   Bilateral Distance:    Right Eye Near:   Left Eye Near:    Bilateral Near:     Physical Exam Constitutional:      General: He is not in acute distress. HENT:     Head: Normocephalic and atraumatic.  Eyes:     General: No scleral icterus.    Pupils: Pupils are equal, round, and reactive to light.  Cardiovascular:     Rate and Rhythm: Normal rate.  Pulmonary:     Effort: Pulmonary effort is normal. No respiratory distress.     Breath sounds: No wheezing.  Musculoskeletal:     Comments: ADL foot pain with decreased ROM secondary pain.  NVI  Skin:    Coloration: Skin is not jaundiced  or pale.  Neurological:     Mental Status: He is alert and oriented to person, place, and time.      UC Treatments / Results  Labs (all labs ordered are listed, but only abnormal results are displayed) Labs Reviewed - No data to display  EKG   Radiology DG Foot Complete Right  Result Date: 11/10/2019 CLINICAL DATA:  24 year old male with trauma to the right foot. EXAM: RIGHT FOOT COMPLETE - 3+ VIEW COMPARISON:  Right ankle radiograph dated 02/13/2019 FINDINGS: There is no acute fracture or dislocation. The bones are well mineralized. No arthritic changes. The soft tissues are unremarkable. IMPRESSION: Negative. Electronically Signed   By: Anner Crete M.D.   On: 11/10/2019 19:56    Procedures Procedures (including critical care time)  Medications Ordered in UC Medications - No data to display  Initial Impression / Assessment and Plan / UC Course  I have reviewed the triage vital signs and the nursing notes.  Pertinent labs & imaging results that were available during my care of the patient were reviewed by me and considered in my medical decision making (see chart for details).     Right foot x-ray done in office, reviewed by me radiology: Negative for fracture  dislocation.  Reviewed findings with patient verbalized understanding.  Treat supportively as outlined below.  Return precautions discussed, patient verbalized understanding and is agreeable to plan. Final Clinical Impressions(s) / UC Diagnoses   Final diagnoses:  Right foot injury, initial encounter     Discharge Instructions     Recommend RICE: rest, ice, compression, elevation as needed for pain.    Heat therapy (hot compress, warm wash rag, hot showers, etc.) can help relax muscles and soothe muscle aches. Cold therapy (ice packs) can be used to help swelling both after injury and after prolonged use of areas of chronic pain/aches.  For pain: recommend 350 mg-1000 mg of Tylenol (acetaminophen) and/or 200 mg - 800 mg of Advil (ibuprofen, Motrin) every 8 hours as needed.  May alternate between the two throughout the day as they are generally safe to take together.  DO NOT exceed more than 3000 mg of Tylenol or 3200 mg of ibuprofen in a 24 hour period as this could damage your stomach, kidneys, liver, or increase your bleeding risk.    ED Prescriptions    None     PDMP not reviewed this encounter.   [place], [place], [place] 11/10/19 2004

## 2019-11-23 ENCOUNTER — Other Ambulatory Visit: Payer: Self-pay | Admitting: Family Medicine

## 2019-11-23 ENCOUNTER — Other Ambulatory Visit: Payer: Self-pay

## 2019-11-23 ENCOUNTER — Ambulatory Visit: Payer: Self-pay

## 2019-11-23 DIAGNOSIS — M25571 Pain in right ankle and joints of right foot: Secondary | ICD-10-CM

## 2020-04-19 ENCOUNTER — Ambulatory Visit
Admission: EM | Admit: 2020-04-19 | Discharge: 2020-04-19 | Disposition: A | Discharge status: Home / Self Care | Payer: BC Managed Care – PPO | Attending: Emergency Medicine | Admitting: Emergency Medicine

## 2020-04-19 DIAGNOSIS — H18892 Other specified disorders of cornea, left eye: Secondary | ICD-10-CM

## 2020-04-19 MED ORDER — POLYMYXIN B-TRIMETHOPRIM 10000-0.1 UNIT/ML-% OP SOLN: 1.0000 [drp] | OPHTHALMIC | 0 refills | Status: DC

## 2020-04-19 NOTE — ED Triage Notes (Signed)
Pt states "at work and a beetle bug sprayed fluid in my eye". Pt c/o lt eye burning and achy. Denies vision disturbance. States washed out lt eye with water and saline.

## 2020-04-19 NOTE — ED Provider Notes (Signed)
[facility]    CSN: [id number] Arrival date & time: 04/19/20  1547      History   Chief Complaint Chief Complaint  Patient presents with  . Eye Pain    HPI Bradley Larson is a 24 y.o. male  Presented for left eye pain, irritation without visual changes since a bug "sprayed flutamide "at work. States he is able to irrigate it at work with water and saline.  Past Medical History:  Diagnosis Date  . Anxiety   . Meniscus tear   . Migraines     There are no problems to display for this patient.   Past Surgical History:  Procedure Laterality Date  . FRACTURE SURGERY Right    thumb       Home Medications    Prior to Admission medications   Medication Sig Start Date End Date Taking? Authorizing Provider  trimethoprim-polymyxin b (POLYTRIM) ophthalmic solution Place 1 drop into the left eye every 4 (four) hours. 04/19/20   Hall-Potvin, [place], PA-C    Family History Family History  Problem Relation Age of Onset  . Asthma Mother   . Diabetes Other   . Hypertension Other   . CAD Other     Social History Social History   Tobacco Use  . Smoking status: Never Smoker  . Smokeless tobacco: Never Used  Vaping Use  . Vaping Use: Former  Substance Use Topics  . Alcohol use: Not Currently    Comment: Twice a month  . Drug use: No     Allergies   Patient has no known allergies.   Review of Systems Review of Systems  Constitutional: Negative for fatigue and fever.  Eyes: Positive for pain, redness and itching. Negative for photophobia, discharge and visual disturbance.  Respiratory: Negative for cough and shortness of breath.   Cardiovascular: Negative for chest pain and palpitations.  Gastrointestinal: Negative for abdominal pain, diarrhea and vomiting.  Musculoskeletal: Negative for arthralgias and myalgias.  Skin: Negative for rash and wound.  Neurological: Negative for speech difficulty and headaches.  All other systems reviewed and  are negative.    Physical Exam Triage Vital Signs ED Triage Vitals  Enc Vitals Group     BP 04/19/20 1558 131/89     Pulse Rate 04/19/20 1558 80     Resp 04/19/20 1558 18     Temp 04/19/20 1558 98.2 F (36.8 C)     Temp Source 04/19/20 1558 Oral     SpO2 04/19/20 1558 95 %     Weight --      Height --      Head Circumference --      Peak Flow --      Pain Score 04/19/20 1601 6     Pain Loc --      Pain Edu? --      Excl. in [place]? --    No data found.  Updated Vital Signs BP 131/89 (BP Location: Left Arm)   Pulse 80   Temp 98.2 F (36.8 C) (Oral)   Resp 18   SpO2 95%   Visual Acuity Right Eye Distance: 20/30 Left Eye Distance: 20/40 Bilateral Distance: 20/20  Right Eye Near:   Left Eye Near:    Bilateral Near:     Physical Exam Constitutional:      General: He is not in acute distress. HENT:     Head: Normocephalic and atraumatic.  Eyes:     General: No scleral icterus.  Right eye: No discharge.        Left eye: No discharge.     Extraocular Movements: Extraocular movements intact.     Pupils: Pupils are equal, round, and reactive to light.     Comments: Left eye with conjunctival injection, no chemosis or foreign body.  Cardiovascular:     Rate and Rhythm: Normal rate.  Pulmonary:     Effort: Pulmonary effort is normal. No respiratory distress.     Breath sounds: No wheezing.  Skin:    Coloration: Skin is not jaundiced or pale.  Neurological:     Mental Status: He is alert and oriented to person, place, and time.      UC Treatments / Results  Labs (all labs ordered are listed, but only abnormal results are displayed) Labs Reviewed - No data to display  EKG   Radiology No results found.  Procedures Procedures (including critical care time)  Medications Ordered in UC Medications - No data to display  Initial Impression / Assessment and Plan / UC Course  I have reviewed the triage vital signs and the nursing notes.  Pertinent  labs & imaging results that were available during my care of the patient were reviewed by me and considered in my medical decision making (see chart for details).     I successfully irrigated prior to arrival without foreign body or visual change. Given irritation, itching, will cover for secondary infection with Polytrim, follow-up with ophthalmology.  Return precautions discussed, pt verbalized understanding and is agreeable to plan. Final Clinical Impressions(s) / UC Diagnoses   Final diagnoses:  Corneal irritation of left eye     Discharge Instructions     Use eyedrops as directed on eye(s) as prescribed.  May use artificial tear gel/drops at needed. Important to use artificial tear gel/drops last and wait 10-15 minutes between drops as it can prevent your prescription drops from working properly.  Important to follow up with Ophthalmology (eye doctor). Return sooner for worsening of symptoms, change in vision, sensitivity to light, eye swelling, painful eye movement, or fever.     ED Prescriptions    Medication Sig Dispense Auth. Provider   trimethoprim-polymyxin b (POLYTRIM) ophthalmic solution Place 1 drop into the left eye every 4 (four) hours. 10 mL Hall-Potvin, [place], PA-C     PDMP not reviewed this encounter.   Hall-Potvin, [place], [place] 04/19/20 1703

## 2020-04-19 NOTE — Discharge Instructions (Signed)
Use eyedrops as directed on eye(s) as prescribed.  May use artificial tear gel/drops at needed. °Important to use artificial tear gel/drops last and wait 10-15 minutes between drops as it can prevent your prescription drops from working properly.  °Important to follow up with Ophthalmology (eye doctor). °Return sooner for worsening of symptoms, change in vision, sensitivity to light, eye swelling, painful eye movement, or fever.  °

## 2020-05-13 ENCOUNTER — Ambulatory Visit
Admission: EM | Admit: 2020-05-13 | Discharge: 2020-05-13 | Disposition: A | Discharge status: Home / Self Care | Payer: BC Managed Care – PPO | Attending: Family Medicine | Admitting: Family Medicine

## 2020-05-13 DIAGNOSIS — Z1152 Encounter for screening for COVID-19: Secondary | ICD-10-CM

## 2020-05-13 DIAGNOSIS — J069 Acute upper respiratory infection, unspecified: Secondary | ICD-10-CM

## 2020-05-13 MED ORDER — CETIRIZINE HCL 10 MG PO TABS: 10.0000 mg | ORAL_TABLET | Freq: Every day | ORAL | 11 refills | Status: AC

## 2020-05-13 MED ORDER — FLUTICASONE PROPIONATE 50 MCG/ACT NA SUSP: 2.0000 | Freq: Every day | NASAL | 12 refills | Status: AC

## 2020-05-13 MED ORDER — BENZONATATE 100 MG PO CAPS: 100.0000 mg | ORAL_CAPSULE | Freq: Three times a day (TID) | ORAL | 0 refills | Status: DC | PRN

## 2020-05-13 MED ORDER — PREDNISONE 20 MG PO TABS: 20.0000 mg | ORAL_TABLET | Freq: Every day | ORAL | 0 refills | Status: AC

## 2020-05-13 NOTE — Discharge Instructions (Addendum)
Your Covid test should result within the next 48 to 72 hours.  Work note provided to allow you to return to work on 05/16/2020.  If your test is positive you will need to quarantine for total of 10 days

## 2020-05-13 NOTE — ED Provider Notes (Signed)
[facility]    CSN: [id number] Arrival date & time: 05/13/20  1410      History   Chief Complaint Chief Complaint  Patient presents with  . Cough    HPI Bradley Larson is a 24 y.o. male.   HPI  Patient presents with URI symptoms of cough, sore throat headache and fatigue and nasal congestion x3 days.  Patient states he had a negative Covid test x3 days ago.  He endorses body aches but denies shortness of breath, wheezing or chest tightness.  His main concern is his throat as he is having pain and difficulty swallowing, not drinking lots of fluids due to pain. Past Medical History:  Diagnosis Date  . Anxiety   . Meniscus tear   . Migraines     There are no problems to display for this patient.   Past Surgical History:  Procedure Laterality Date  . FRACTURE SURGERY Right    thumb       Home Medications    Prior to Admission medications   Not on File    Family History Family History  Problem Relation Age of Onset  . Asthma Mother   . Diabetes Other   . Hypertension Other   . CAD Other     Social History Social History   Tobacco Use  . Smoking status: Never Smoker  . Smokeless tobacco: Never Used  Vaping Use  . Vaping Use: Former  Substance Use Topics  . Alcohol use: Not Currently    Comment: Twice a month  . Drug use: No     Allergies   Patient has no known allergies.   Review of Systems Review of Systems Pertinent negatives listed in HPI  Physical Exam Triage Vital Signs ED Triage Vitals  Enc Vitals Group     BP 05/13/20 1550 126/76     Pulse Rate 05/13/20 1550 (!) 102     Resp 05/13/20 1550 18     Temp 05/13/20 1550 98.2 F (36.8 C)     Temp Source 05/13/20 1550 Oral     SpO2 05/13/20 1550 96 %     Weight --      Height --      Head Circumference --      Peak Flow --      Pain Score 05/13/20 1551 6     Pain Loc --      Pain Edu? --      Excl. in [place]? --    No data found.  Updated Vital Signs BP 126/76  (BP Location: Left Arm)   Pulse (!) 102   Temp 98.2 F (36.8 C) (Oral)   Resp 18   SpO2 96%   Visual Acuity Right Eye Distance:   Left Eye Distance:   Bilateral Distance:    Right Eye Near:   Left Eye Near:    Bilateral Near:     Physical Exam General Appearance:    Alert, cooperative, non-ill-appearing, No distress  HENT:  Normocephalic, external ears normal, bilateral nares mucosa erythema and edema present, congestion present, oropharynx erythematous and edematous without exudate  Eyes:    PERRL, conjunctiva/corneas clear, EOM's intact       Lungs:     Clear to auscultation bilaterally, respirations unlabored  Heart:    Regular rate and rhythm  Neurologic:   Awake, alert, oriented x 3. No apparent focal neurological           defect.  UC Treatments / Results  Labs (all labs ordered are listed, but only abnormal results are displayed) Labs Reviewed - No data to display  EKG   Radiology No results found.  Procedures Procedures (including critical care time)  Medications Ordered in UC Medications - No data to display  Initial Impression / Assessment and Plan / UC Course  I have reviewed the triage vital signs and the nursing notes.  Pertinent labs & imaging results that were available during my care of the patient were reviewed by me and considered in my medical decision making (see chart for details).    Covid testing pending advised to quarantine until results are known. Treating symptoms of a respiratory illness given likely viral.  Recommend Flonase and cetirizine for nasal symptoms.  Benzonatate for cough.  And prednisone for pharyngeal irritation and inflammation.   Final Clinical Impressions(s) / UC Diagnoses   Final diagnoses:  Acute upper respiratory infection  Encounter for screening for COVID-19     Discharge Instructions     Your Covid test should result within the next 48 to 72 hours.  Work note provided to allow you to return to work  on 05/16/2020.  If your test is positive you will need to quarantine for total of 10 days    ED Prescriptions    Medication Sig Dispense Auth. Provider   fluticasone (FLONASE) 50 MCG/ACT nasal spray Place 2 sprays into both nostrils daily. 16 g Scot Jun, FNP   cetirizine (ZYRTEC) 10 MG tablet Take 1 tablet (10 mg total) by mouth daily. 30 tablet Scot Jun, FNP   benzonatate (TESSALON) 100 MG capsule Take 1-2 capsules (100-200 mg total) by mouth 3 (three) times daily as needed for cough. 40 capsule Scot Jun, FNP   predniSONE (DELTASONE) 20 MG tablet Take 1 tablet (20 mg total) by mouth daily with breakfast for 5 days. 5 tablet Scot Jun, FNP     PDMP not reviewed this encounter.   Scot Jun, FNP 05/15/20 1019

## 2020-05-13 NOTE — ED Triage Notes (Signed)
Pt c/o cough, sore throat, headache, fatigue, and nasal congestion x3 days. States neg covid test 3 days ago.

## 2020-05-14 LAB — NOVEL CORONAVIRUS, NAA: SARS-CoV-2, NAA: NOT DETECTED

## 2020-05-14 LAB — SARS-COV-2, NAA 2 DAY TAT

## 2020-05-18 ENCOUNTER — Telehealth (HOSPITAL_COMMUNITY): Payer: Self-pay | Admitting: Emergency Medicine

## 2020-05-18 NOTE — Telephone Encounter (Signed)
Patient called concerning COVID results.  No result in chart from [place], so I called [facility] to follow up.  Representative states the test has resulted and it is NOT DETECTED.  Called patient and reviewed with him, will put note in mychart for him while we wait for the computer to catch up

## 2020-07-06 ENCOUNTER — Other Ambulatory Visit: Payer: Self-pay

## 2020-07-06 ENCOUNTER — Ambulatory Visit (INDEPENDENT_AMBULATORY_CARE_PROVIDER_SITE_OTHER): Payer: Worker's Compensation

## 2020-07-06 ENCOUNTER — Encounter: Payer: Self-pay | Admitting: Urgent Care

## 2020-07-06 ENCOUNTER — Ambulatory Visit
Admission: EM | Admit: 2020-07-06 | Discharge: 2020-07-06 | Disposition: A | Discharge status: Home / Self Care | Payer: Worker's Compensation | Attending: Urgent Care | Admitting: Urgent Care

## 2020-07-06 DIAGNOSIS — M25561 Pain in right knee: Secondary | ICD-10-CM

## 2020-07-06 DIAGNOSIS — S86911A Strain of unspecified muscle(s) and tendon(s) at lower leg level, right leg, initial encounter: Secondary | ICD-10-CM

## 2020-07-06 MED ORDER — NAPROXEN 500 MG PO TABS: 500.0000 mg | ORAL_TABLET | Freq: Two times a day (BID) | ORAL | 0 refills | Status: AC

## 2020-07-06 NOTE — ED Triage Notes (Signed)
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 burning sensation in his right knee

## 2020-07-06 NOTE — ED Provider Notes (Signed)
[facility]   MRN: [id number] DOB: [birth year]  Subjective:   Bradley Larson is a 25 y.o. male presenting for acute onset this morning of right knee pain and stiffness.  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  Feels like he hyperextended his leg.  Has had difficulty using his leg as he normally would.  No redness, ecchymosis, swelling, bruising, warmth.  No current facility-administered medications for this encounter.  Current Outpatient Medications:  .  benzonatate (TESSALON) 100 MG capsule, Take 1-2 capsules (100-200 mg total) by mouth 3 (three) times daily as needed for cough., Disp: 40 capsule, Rfl: 0 .  cetirizine (ZYRTEC) 10 MG tablet, Take 1 tablet (10 mg total) by mouth daily., Disp: 30 tablet, Rfl: 11 .  fluticasone (FLONASE) 50 MCG/ACT nasal spray, Place 2 sprays into both nostrils daily., Disp: 16 g, Rfl: 12   No Known Allergies  Past Medical History:  Diagnosis Date  . Anxiety   . Meniscus tear   . Migraines      Past Surgical History:  Procedure Laterality Date  . FRACTURE SURGERY Right    thumb    Family History  Problem Relation Age of Onset  . Asthma Mother   . Diabetes Other   . Hypertension Other   . CAD Other     Social History   Tobacco Use  . Smoking status: Never Smoker  . Smokeless tobacco: Never Used  Vaping Use  . Vaping Use: Former  Substance Use Topics  . Alcohol use: Not Currently    Comment: Twice a month  . Drug use: No    ROS   Objective:   Vitals: BP 116/78 (BP Location: Right Arm)   Pulse 91   Temp 97.9 F (36.6 C) (Oral)   Resp 16   SpO2 96%   Physical Exam Constitutional:      General: He is not in acute distress.    Appearance: Normal appearance. He is well-developed and normal weight. He is not ill-appearing, toxic-appearing or diaphoretic.  HENT:     Head: Normocephalic and atraumatic.     Right Ear: External ear normal.     Left Ear: External ear normal.      Nose: Nose normal.     Mouth/Throat:     Pharynx: Oropharynx is clear.  Eyes:     General: No scleral icterus.       Right eye: No discharge.        Left eye: No discharge.     Extraocular Movements: Extraocular movements intact.     Pupils: Pupils are equal, round, and reactive to light.  Cardiovascular:     Rate and Rhythm: Normal rate.  Pulmonary:     Effort: Pulmonary effort is normal.  Musculoskeletal:     Cervical back: Normal range of motion.     Right knee: No swelling, deformity, effusion, erythema, ecchymosis, lacerations, bony tenderness or crepitus. Decreased range of motion. Tenderness present over the lateral joint line. No medial joint line or patellar tendon tenderness. Normal alignment and normal patellar mobility.  Neurological:     Mental Status: He is alert and oriented to person, place, and time.  Psychiatric:        Mood and Affect: Mood normal.        Behavior: Behavior normal.        Thought Content: Thought content normal.        Judgment: Judgment normal.     DG Knee Complete  4 Views Right  Result Date: 07/06/2020 CLINICAL DATA:  Right knee injury EXAM: RIGHT KNEE - COMPLETE 4+ VIEW COMPARISON:  07/02/2017 FINDINGS: No fracture or malalignment. Mild degenerative change of the lateral joint space. No significant knee effusion. IMPRESSION: No acute osseous abnormality. Electronically Signed   By: Donavan Foil M.D.   On: 07/06/2020 17:43   Assessment and Plan :   PDMP not reviewed this encounter.  1. Acute pain of right knee   2. Knee strain, right, initial encounter     Offered patient an Ace wrap but he refused as he has one at home.  Recommended conservative management including rice method, naproxen for pain and inflammation.  Follow-up with occupational health. Counseled patient on potential for adverse effects with medications prescribed/recommended today, ER and return-to-clinic precautions discussed, patient verbalized understanding.    Jaynee Eagles, PA-C 07/06/20 1755

## 2020-11-14 ENCOUNTER — Ambulatory Visit
Admission: EM | Admit: 2020-11-14 | Discharge: 2020-11-14 | Disposition: A | Discharge status: Home / Self Care | Payer: BC Managed Care – PPO | Attending: Emergency Medicine | Admitting: Emergency Medicine

## 2020-11-14 ENCOUNTER — Encounter: Payer: Self-pay | Admitting: Emergency Medicine

## 2020-11-14 ENCOUNTER — Other Ambulatory Visit: Payer: Self-pay

## 2020-11-14 DIAGNOSIS — R1012 Left upper quadrant pain: Secondary | ICD-10-CM | POA: Diagnosis not present

## 2020-11-14 NOTE — ED Provider Notes (Signed)
[facility]    CSN: [id number] Arrival date & time: 11/14/20  1229      History   Chief Complaint Chief Complaint  Patient presents with  . Abdominal Pain    HPI Bradley Larson is a 25 y.o. male presenting today for evaluation of abdominal pain.  Reports over the past 2 weeks he has had intermittent discomfort in his left abdomen.  Over the past 3 days pain has become more constant and stayed.  Reports sharp pain in this area worse with certain movements, changing position.  Denies any nausea or vomiting.  Oral intake at baseline without affecting pain.  Has felt slightly constipated and stool smaller and less frequent than normal.  Denies history of GI problems or prior abdominal surgeries.  Denies urinary symptoms.  HPI  Past Medical History:  Diagnosis Date  . Anxiety   . Meniscus tear   . Migraines     There are no problems to display for this patient.   Past Surgical History:  Procedure Laterality Date  . FRACTURE SURGERY Right    thumb       Home Medications    Prior to Admission medications   Medication Sig Start Date End Date Taking? Authorizing Provider  cetirizine (ZYRTEC) 10 MG tablet Take 1 tablet (10 mg total) by mouth daily. 05/13/20   Scot Jun, FNP  fluticasone (FLONASE) 50 MCG/ACT nasal spray Place 2 sprays into both nostrils daily. 05/13/20   Scot Jun, FNP  naproxen (NAPROSYN) 500 MG tablet Take 1 tablet (500 mg total) by mouth 2 (two) times daily with a meal. 07/06/20   Jaynee Eagles, PA-C    Family History Family History  Problem Relation Age of Onset  . Asthma Mother   . Diabetes Other   . Hypertension Other   . CAD Other     Social History Social History   Tobacco Use  . Smoking status: Never Smoker  . Smokeless tobacco: Never Used  Vaping Use  . Vaping Use: Former  Substance Use Topics  . Alcohol use: Not Currently    Comment: Twice a month  . Drug use: No     Allergies   Patient has no known  allergies.   Review of Systems Review of Systems  Constitutional: Negative for fever.  HENT: Negative for sore throat.   Respiratory: Negative for shortness of breath.   Cardiovascular: Negative for chest pain.  Gastrointestinal: Positive for abdominal pain and constipation. Negative for nausea and vomiting.  Genitourinary: Negative for difficulty urinating, dysuria, frequency, penile discharge, penile pain, penile swelling, scrotal swelling and testicular pain.  Skin: Negative for rash.  Neurological: Negative for dizziness, light-headedness and headaches.     Physical Exam Triage Vital Signs ED Triage Vitals  Enc Vitals Group     BP      Pulse      Resp      Temp      Temp src      SpO2      Weight      Height      Head Circumference      Peak Flow      Pain Score      Pain Loc      Pain Edu?      Excl. in [place]?    No data found.  Updated Vital Signs BP 138/87 (BP Location: Left Arm)   Pulse 79   Temp 98 F (36.7 C) (Oral)  Resp 18   SpO2 94%   Visual Acuity Right Eye Distance:   Left Eye Distance:   Bilateral Distance:    Right Eye Near:   Left Eye Near:    Bilateral Near:     Physical Exam Vitals and nursing note reviewed.  Constitutional:      Appearance: He is well-developed.     Comments: No acute distress  HENT:     Head: Normocephalic and atraumatic.     Nose: Nose normal.  Eyes:     Conjunctiva/sclera: Conjunctivae normal.  Cardiovascular:     Rate and Rhythm: Normal rate and regular rhythm.  Pulmonary:     Effort: Pulmonary effort is normal. No respiratory distress.     Comments: Breathing comfortably at rest, CTABL, no wheezing, rales or other adventitious sounds auscultated Abdominal:     General: There is no distension.     Comments: Soft, nondistended, tender to palpation to middle left abdomen/periumbilical area on left side, negative rebound, negative Rovsing, negative McBurney's, negative Murphy's  Musculoskeletal:         General: Normal range of motion.     Cervical back: Neck supple.  Skin:    General: Skin is warm and dry.  Neurological:     Mental Status: He is alert and oriented to person, place, and time.      UC Treatments / Results  Labs (all labs ordered are listed, but only abnormal results are displayed) Labs Reviewed  CBC  COMPREHENSIVE METABOLIC PANEL  LIPASE    EKG   Radiology No results found.  Procedures Procedures (including critical care time)  Medications Ordered in UC Medications - No data to display  Initial Impression / Assessment and Plan / UC Course  I have reviewed the triage vital signs and the nursing notes.  Pertinent labs & imaging results that were available during my care of the patient were reviewed by me and considered in my medical decision making (see chart for details).     Blood work pending to further evaluate abdominal pain given symptoms x2 weeks with recent worsening over the past few days.  Negative peritoneal signs, low suspicious of constipation and recommending treatment with stool softener/laxatives as well as lifestyle modifications.  Recommendations provided.  Also discussed possible MSK etiology given worse with movement as well as oral intake at baseline, trial of NSAIDs recommended as well.  We will call with blood work results and alter therapy as needed based off results.  Discussed strict return precautions. Patient verbalized understanding and is agreeable with plan.  Final Clinical Impressions(s) / UC Diagnoses   Final diagnoses:  Abdominal pain, left upper quadrant     Discharge Instructions     Please use Miralax for moderate to severe constipation. Take this once a day for the next 2-3 days. Please also start docusate stool softener, twice a day for at least 1 week. If stools become loose, cut down to once a day for another week. If stools remain loose, cut back to 1 pill every other day for a third week. You can stop  docusate thereafter and resume as needed for constipation.  May consider trying over the counter magnesium citrate or milk of magnesia as alternative/in addition  To help reduce constipation and promote bowel health: 1. Drink at least 64 ounces of water each day 2. Eat plenty of fiber (fruits, vegetables, whole grains, legumes) 3. Be physically active or exercise including walking, jogging, swimming, yoga, etc. 4. For active constipation use  a stool softener (docusate) or an osmotic laxative (like Miralax) each day, or as needed.    ED Prescriptions    None     PDMP not reviewed this encounter.   Janith Lima, PA-C 11/14/20 1426

## 2020-11-14 NOTE — ED Triage Notes (Signed)
Pt sts mid abd pain x 3 days; pt sts has felt constipated over last several days but last BM was yesterday

## 2020-11-14 NOTE — Discharge Instructions (Signed)
Please use Miralax for moderate to severe constipation. Take this once a day for the next 2-3 days. Please also start docusate stool softener, twice a day for at least 1 week. If stools become loose, cut down to once a day for another week. If stools remain loose, cut back to 1 pill every other day for a third week. You can stop docusate thereafter and resume as needed for constipation.  May consider trying over the counter magnesium citrate or milk of magnesia as alternative/in addition  To help reduce constipation and promote bowel health: 1. Drink at least 64 ounces of water each day 2. Eat plenty of fiber (fruits, vegetables, whole grains, legumes) 3. Be physically active or exercise including walking, jogging, swimming, yoga, etc. 4. For active constipation use a stool softener (docusate) or an osmotic laxative (like Miralax) each day, or as needed.

## 2020-11-15 LAB — LIPASE: Lipase: 16 U/L (ref 13–78)

## 2020-11-15 LAB — CBC
Hematocrit: 50.3 % (ref 37.5–51.0)
Hemoglobin: 17.1 g/dL (ref 13.0–17.7)
MCH: 29.6 pg (ref 26.6–33.0)
MCHC: 34 g/dL (ref 31.5–35.7)
MCV: 87 fL (ref 79–97)
Platelets: 291 10*3/uL (ref 150–450)
RBC: 5.78 x10E6/uL (ref 4.14–5.80)
RDW: 12.7 % (ref 11.6–15.4)
WBC: 8.3 10*3/uL (ref 3.4–10.8)

## 2020-11-15 LAB — COMPREHENSIVE METABOLIC PANEL
ALT: 23 IU/L (ref 0–44)
AST: 22 IU/L (ref 0–40)
Albumin/Globulin Ratio: 1.4 (ref 1.2–2.2)
Albumin: 4.6 g/dL (ref 4.1–5.2)
Alkaline Phosphatase: 91 IU/L (ref 44–121)
BUN/Creatinine Ratio: 10 (ref 9–20)
BUN: 10 mg/dL (ref 6–20)
Bilirubin Total: 0.6 mg/dL (ref 0.0–1.2)
CO2: 21 mmol/L (ref 20–29)
Calcium: 9.5 mg/dL (ref 8.7–10.2)
Chloride: 102 mmol/L (ref 96–106)
Creatinine, Ser: 1.03 mg/dL (ref 0.76–1.27)
Globulin, Total: 3.2 g/dL (ref 1.5–4.5)
Glucose: 71 mg/dL (ref 65–99)
Potassium: 4.7 mmol/L (ref 3.5–5.2)
Sodium: 141 mmol/L (ref 134–144)
Total Protein: 7.8 g/dL (ref 6.0–8.5)
eGFR: 103 mL/min/{1.73_m2} (ref >59)
